# Patient Record
Sex: Male | Born: 1940 | Race: White | Hispanic: No | Marital: Married | Smoking: Former smoker
Health system: Southern US, Community
[De-identification: ages and names within clinical notes are randomized; demographics above are authoritative.]

## PROBLEM LIST (undated history)

## (undated) DIAGNOSIS — K209 Esophagitis, unspecified without bleeding: Secondary | ICD-10-CM

## (undated) DIAGNOSIS — K219 Gastro-esophageal reflux disease without esophagitis: Secondary | ICD-10-CM

## (undated) DIAGNOSIS — D126 Benign neoplasm of colon, unspecified: Secondary | ICD-10-CM

## (undated) DIAGNOSIS — K2980 Duodenitis without bleeding: Secondary | ICD-10-CM

## (undated) DIAGNOSIS — D759 Disease of blood and blood-forming organs, unspecified: Secondary | ICD-10-CM

## (undated) DIAGNOSIS — E119 Type 2 diabetes mellitus without complications: Secondary | ICD-10-CM

## (undated) DIAGNOSIS — K222 Esophageal obstruction: Secondary | ICD-10-CM

## (undated) DIAGNOSIS — K635 Polyp of colon: Secondary | ICD-10-CM

## (undated) DIAGNOSIS — C801 Malignant (primary) neoplasm, unspecified: Secondary | ICD-10-CM

## (undated) DIAGNOSIS — K5790 Diverticulosis of intestine, part unspecified, without perforation or abscess without bleeding: Secondary | ICD-10-CM

## (undated) DIAGNOSIS — K319 Disease of stomach and duodenum, unspecified: Secondary | ICD-10-CM

## (undated) HISTORY — PX: TONSILLECTOMY: SUR1361

## (undated) HISTORY — PX: COLONOSCOPY: SHX174

## (undated) HISTORY — PX: BACK SURGERY: SHX140

## (undated) HISTORY — PX: APPENDECTOMY: SHX54

## (undated) HISTORY — PX: ESOPHAGOGASTRODUODENOSCOPY: SHX1529

## (undated) HISTORY — PX: COLON SURGERY: SHX602

## (undated) HISTORY — PX: OTHER SURGICAL HISTORY: SHX169

---

## 2007-12-16 ENCOUNTER — Ambulatory Visit: Payer: Self-pay | Admitting: Gastroenterology

## 2008-01-08 ENCOUNTER — Other Ambulatory Visit: Payer: Self-pay

## 2008-01-09 ENCOUNTER — Inpatient Hospital Stay: Payer: Self-pay | Admitting: Surgery

## 2008-12-10 ENCOUNTER — Ambulatory Visit: Payer: Self-pay | Admitting: Gastroenterology

## 2011-06-02 ENCOUNTER — Ambulatory Visit: Payer: Self-pay | Admitting: Gastroenterology

## 2011-06-05 LAB — PATHOLOGY REPORT

## 2014-08-18 ENCOUNTER — Ambulatory Visit: Payer: Self-pay | Admitting: Gastroenterology

## 2014-08-18 DIAGNOSIS — K222 Esophageal obstruction: Secondary | ICD-10-CM

## 2014-08-18 HISTORY — DX: Esophageal obstruction: K22.2

## 2014-09-16 ENCOUNTER — Ambulatory Visit: Payer: Self-pay | Admitting: Gastroenterology

## 2014-09-17 LAB — CREATININE, SERUM
Creatinine: 1.24 mg/dL
GFR CALC NON AF AMER: 58 — AB

## 2014-10-16 ENCOUNTER — Other Ambulatory Visit: Payer: Self-pay | Admitting: Gastroenterology

## 2014-10-16 DIAGNOSIS — K529 Noninfective gastroenteritis and colitis, unspecified: Secondary | ICD-10-CM

## 2014-10-19 LAB — SURGICAL PATHOLOGY

## 2014-10-28 ENCOUNTER — Other Ambulatory Visit: Payer: Self-pay | Admitting: Gastroenterology

## 2014-10-28 ENCOUNTER — Ambulatory Visit
Admission: RE | Admit: 2014-10-28 | Discharge: 2014-10-28 | Disposition: A | Discharge status: Home / Self Care | Payer: Medicare Other | Source: Ambulatory Visit | Attending: Gastroenterology | Admitting: Gastroenterology

## 2014-10-28 DIAGNOSIS — K5730 Diverticulosis of large intestine without perforation or abscess without bleeding: Secondary | ICD-10-CM | POA: Diagnosis not present

## 2014-10-28 DIAGNOSIS — Z1211 Encounter for screening for malignant neoplasm of colon: Secondary | ICD-10-CM | POA: Diagnosis not present

## 2014-10-28 DIAGNOSIS — Z539 Procedure and treatment not carried out, unspecified reason: Secondary | ICD-10-CM | POA: Insufficient documentation

## 2014-10-28 DIAGNOSIS — K529 Noninfective gastroenteritis and colitis, unspecified: Secondary | ICD-10-CM

## 2015-01-01 ENCOUNTER — Encounter: Payer: Self-pay | Admitting: *Deleted

## 2015-01-04 ENCOUNTER — Ambulatory Visit
Admission: RE | Admit: 2015-01-04 | Discharge: 2015-01-04 | Disposition: A | Discharge status: Home / Self Care | Payer: Medicare Other | Source: Ambulatory Visit | Attending: Gastroenterology | Admitting: Gastroenterology

## 2015-01-04 ENCOUNTER — Ambulatory Visit: Payer: Medicare Other | Admitting: Anesthesiology

## 2015-01-04 ENCOUNTER — Encounter
Admission: RE | Disposition: A | Discharge status: Home / Self Care | Payer: Self-pay | Source: Ambulatory Visit | Attending: Gastroenterology

## 2015-01-04 DIAGNOSIS — Z79899 Other long term (current) drug therapy: Secondary | ICD-10-CM | POA: Diagnosis not present

## 2015-01-04 DIAGNOSIS — E669 Obesity, unspecified: Secondary | ICD-10-CM | POA: Diagnosis not present

## 2015-01-04 DIAGNOSIS — K2980 Duodenitis without bleeding: Secondary | ICD-10-CM | POA: Insufficient documentation

## 2015-01-04 DIAGNOSIS — K317 Polyp of stomach and duodenum: Secondary | ICD-10-CM | POA: Insufficient documentation

## 2015-01-04 DIAGNOSIS — J449 Chronic obstructive pulmonary disease, unspecified: Secondary | ICD-10-CM | POA: Diagnosis not present

## 2015-01-04 DIAGNOSIS — K210 Gastro-esophageal reflux disease with esophagitis: Secondary | ICD-10-CM | POA: Diagnosis not present

## 2015-01-04 DIAGNOSIS — F17210 Nicotine dependence, cigarettes, uncomplicated: Secondary | ICD-10-CM | POA: Diagnosis not present

## 2015-01-04 DIAGNOSIS — Z859 Personal history of malignant neoplasm, unspecified: Secondary | ICD-10-CM | POA: Diagnosis not present

## 2015-01-04 DIAGNOSIS — K2970 Gastritis, unspecified, without bleeding: Secondary | ICD-10-CM | POA: Diagnosis not present

## 2015-01-04 DIAGNOSIS — E119 Type 2 diabetes mellitus without complications: Secondary | ICD-10-CM | POA: Insufficient documentation

## 2015-01-04 DIAGNOSIS — Z86010 Personal history of colonic polyps: Secondary | ICD-10-CM | POA: Insufficient documentation

## 2015-01-04 DIAGNOSIS — K319 Disease of stomach and duodenum, unspecified: Secondary | ICD-10-CM | POA: Insufficient documentation

## 2015-01-04 HISTORY — PX: ESOPHAGOGASTRODUODENOSCOPY: SHX5428

## 2015-01-04 HISTORY — DX: Gastro-esophageal reflux disease without esophagitis: K21.9

## 2015-01-04 HISTORY — DX: Type 2 diabetes mellitus without complications: E11.9

## 2015-01-04 HISTORY — DX: Malignant (primary) neoplasm, unspecified: C80.1

## 2015-01-04 HISTORY — DX: Disease of blood and blood-forming organs, unspecified: D75.9

## 2015-01-04 HISTORY — DX: Duodenitis without bleeding: K29.80

## 2015-01-04 HISTORY — DX: Benign neoplasm of colon, unspecified: D12.6

## 2015-01-04 SURGERY — EGD (ESOPHAGOGASTRODUODENOSCOPY)
Anesthesia: General

## 2015-01-04 MED ORDER — GLYCOPYRROLATE 0.2 MG/ML IJ SOLN
INTRAMUSCULAR | Status: DC | PRN
  Administered 2015-01-04: 0.2 mg via INTRAVENOUS

## 2015-01-04 MED ORDER — GLUCAGON HCL RDNA (DIAGNOSTIC) 1 MG IJ SOLR
INTRAMUSCULAR | Status: DC
Start: 2015-01-04 — End: 2015-01-04
  Filled 2015-01-04: qty 1

## 2015-01-04 MED ORDER — EPINEPHRINE HCL 0.1 MG/ML IJ SOSY
PREFILLED_SYRINGE | INTRAMUSCULAR | Status: AC
  Filled 2015-01-04: qty 10

## 2015-01-04 MED ORDER — PROPOFOL 10 MG/ML IV BOLUS
INTRAVENOUS | Status: DC | PRN
  Administered 2015-01-04: 50 mg via INTRAVENOUS

## 2015-01-04 MED ORDER — PROPOFOL INFUSION 10 MG/ML OPTIME
INTRAVENOUS | Status: DC | PRN
Start: 2015-01-04 — End: 2015-01-04
  Administered 2015-01-04: 50 ug/kg/min via INTRAVENOUS

## 2015-01-04 MED ORDER — LIDOCAINE HCL (CARDIAC) 20 MG/ML IV SOLN
INTRAVENOUS | Status: DC | PRN
  Administered 2015-01-04: 50 mg via INTRAVENOUS

## 2015-01-04 MED ORDER — GLUCAGON (RDNA) 1 MG IJ KIT
PACK | INTRAMUSCULAR | Status: DC | PRN
  Administered 2015-01-04 (×2): .2 mg via INTRAVENOUS

## 2015-01-04 MED ORDER — GLUCAGON HCL (RDNA) 1 MG IJ SOLR
INTRAMUSCULAR | Status: DC | PRN
  Administered 2015-01-04 (×2): 0.2 mg via INTRAVENOUS

## 2015-01-04 MED ORDER — SODIUM CHLORIDE 0.9 % IJ SOLN
INTRAMUSCULAR | Status: DC | PRN
  Administered 2015-01-04: 5 mL via INTRAMUSCULAR

## 2015-01-04 MED ORDER — SODIUM CHLORIDE 0.9 % IV SOLN
INTRAVENOUS | Status: DC
  Administered 2015-01-04: 09:00:00 via INTRAVENOUS
  Administered 2015-01-04: 1000 mL via INTRAVENOUS

## 2015-01-04 NOTE — Transfer of Care (Signed)
Immediate Anesthesia Transfer of Care Note  Patient: Nicolas Mayo  Procedure(s) Performed: Procedure(s): ESOPHAGOGASTRODUODENOSCOPY (EGD) (N/A)  Patient Location: PACU, Endo  Anesthesia Type:General  Level of Consciousness: Alert, Awake, Oriented  Airway & Oxygen Therapy: Patient Spontanous Breathing  Post-op Assessment: Report given to RN  Post vital signs: Reviewed and stable  Last Vitals:  Filed Vitals:   01/04/15 1010  BP: 134/44  Pulse: 77  Temp: 36.1 C  Resp: 16    Complications: No apparent anesthesia complications

## 2015-01-04 NOTE — Op Note (Signed)
[facility] Gastroenterology Patient Name: Nicolas Mayo Procedure Date: 01/04/2015 8:47 AM MRN: [id number] Account #: [id number] Date of Birth: 26-Feb-1941 Admit Type: Outpatient Age: 74 Room: [facility] ENDO ROOM 3 Gender: Male Note Status: Finalized Procedure:         Upper GI endoscopy Indications:       Therapeutic procedure, For therapy of polyps in the                     duodenum Providers:         Lollie Sails, MD Medicines:         Monitored Anesthesia Care Complications:     No immediate complications., Minor hemorrhage - stopped                     spontaneously Procedure:         Pre-Anesthesia Assessment:                    - ASA Grade Assessment: III - A patient with severe                     systemic disease.                    After obtaining informed consent, the endoscope was passed                     under direct vision. Throughout the procedure, the                     patient's blood pressure, pulse, and oxygen saturations                     were monitored continuously. The Olympus GIF-160 endoscope                     (S#. [id number]) was introduced through the mouth, and                     advanced to the third part of duodenum. The upper GI                     endoscopy was unusually difficult due to excessive                     bleeding. Successful completion of the procedure was aided                     by controlling the bleeding. Findings:      LA Grade A (one or more mucosal breaks less than 5 mm, not extending       between tops of 2 mucosal folds) esophagitis with no bleeding was found.      The exam of the esophagus was otherwise normal.      Diffuse moderate inflammation characterized by erosions, erythema and       friability was found in the entire examined stomach. Biopsies were taken       with a cold forceps for histology. Biopsies were taken with a cold       forceps for Helicobacter pylori testing.      The cardia and  gastric fundus were normal on retroflexion.      A single 7 mm sessile polyp was found in the second part of the  duodenum. The polyp was removed with a cold snare. Resection and       retrieval were complete. Area was unsuccessfully injected with 5 mL of a       1:10,000 solution of epinephrine for hemostasis. One hemostatic clip was       successfully placed. There was no bleeding at the end of the maneuver.      A single 6 mm sessile polyp with no bleeding was found in the second       part of the duodenum. The polyp was removed with a hot snare. Resection       and retrieval were complete.      A single 4 mm sessile polyp with no bleeding was found in the second       part of the duodenum. The polyp was removed with a cold biopsy forceps.       Resection and retrieval were complete. Impression:        - LA Grade A erosive esophagitis.                    - Gastritis. Biopsied. Recommendation:    - Await pathology results.                    - Use Protonix (pantoprazole) 40 mg PO BID for 1 month.                    - Use Protonix (pantoprazole) 40 mg PO daily indefinitely.                    - Return to GI clinic in 3 weeks. Procedure Code(s): --- Professional ---                    [phone], Esophagogastroduodenoscopy, flexible, transoral;                     with removal of tumor(s), polyp(s), or other lesion(s) by                     snare technique Diagnosis Code(s): --- Professional ---                    530.19, Other esophagitis                    535.50, Unspecified gastritis and gastroduodenitis,                     without mention of hemorrhage                    211.2, Benign neoplasm of duodenum, jejunum, and ileum CPT copyright 2014 American Medical Association. All rights reserved. The codes documented in this report are preliminary and upon coder review may  be revised to meet current compliance requirements. Lollie Sails, MD 01/04/2015 10:17:34 AM This report  has been signed electronically. Number of Addenda: 0 Note Initiated On: 01/04/2015 8:47 AM      [employer]

## 2015-01-04 NOTE — H&P (Signed)
Outpatient short stay form Pre-procedure 01/04/2015 8:53 AM Lollie Sails MD  Primary Physician: [facility]  Reason for visit:  Duodenal adenoma follow-up  History of present illness:  She is a 74 year old male who was last seen for EGD in February 2016. 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 His were noted on biopsy. He is presenting today to assure complete removal.  He does not take any blood thinning medications or aspirin daily.    Current facility-administered medications:  .  0.9 %  sodium chloride infusion, , Intravenous, Continuous, Lollie Sails, MD, Last Rate: 50 mL/hr at 01/04/15 0817, 1,000 mL at 01/04/15 1696  Prescriptions prior to admission  Medication Sig Dispense Refill Last Dose  . imatinib (GLEEVEC) 100 MG tablet Take 100 mg by mouth 3 (three) times daily. Take with meals and large glass of water.Caution:Chemotherapy     . Multiple Vitamin (MULTIVITAMIN) tablet Take 1 tablet by mouth daily.     Marland Kitchen omeprazole (PRILOSEC) 20 MG capsule Take 20 mg by mouth 2 (two) times daily before a meal.        No Known Allergies   Past Medical History  Diagnosis Date  . Cancer   . Blood dyscrasia   . Tubulovillous adenoma polyp of colon   . Diabetes mellitus without complication   . GERD (gastroesophageal reflux disease)   . Duodenitis   . Duodenitis     Review of systems:      Physical Exam    Heart and lungs: Regular rate and rhythm without rub or gallop, lungs are bilaterally clear    HEENT: Normocephalic atraumatic eyes are anicteric    Other:     Pertinant exam for procedure: Soft protuberant nontender nondistended bowel sounds positive normoactive    Planned proceedures: EGD and indicated procedures. I have discussed the risks benefits and complications of procedures to include not limited to bleeding, infection, perforation and the risk of sedation and the patient wishes to proceed.    Lollie Sails, MD Gastroenterology 01/04/2015   8:53 AM

## 2015-01-04 NOTE — Anesthesia Preprocedure Evaluation (Signed)
Anesthesia Evaluation  Patient identified by MRN, date of birth, ID band Patient awake    Reviewed: Allergy & Precautions, NPO status , Patient's Chart, lab work & pertinent test results  Airway Mallampati: III       Dental  (+) Edentulous Lower, Edentulous Upper   Pulmonary COPDCurrent Smoker,  + rhonchi   + decreased breath sounds      Cardiovascular negative cardio ROS Normal cardiovascular exam    Neuro/Psych negative neurological ROS  negative psych ROS   GI/Hepatic Neg liver ROS, GERD-  ,  Endo/Other  diabetes, Type 2  Renal/GU negative Renal ROS     Musculoskeletal negative musculoskeletal ROS (+)   Abdominal (+) + obese,   Peds negative pediatric ROS (+)  Hematology   Anesthesia Other Findings   Reproductive/Obstetrics negative OB ROS                             Anesthesia Physical Anesthesia Plan  ASA: III  Anesthesia Plan: General   Post-op Pain Management:    Induction: Intravenous  Airway Management Planned: Nasal Cannula  Additional Equipment:   Intra-op Plan:   Post-operative Plan:   Informed Consent: I have reviewed the patients History and Physical, chart, labs and discussed the procedure including the risks, benefits and alternatives for the proposed anesthesia with the patient or authorized representative who has indicated his/her understanding and acceptance.     Plan Discussed with: CRNA  Anesthesia Plan Comments:         Anesthesia Quick Evaluation

## 2015-01-04 NOTE — Anesthesia Postprocedure Evaluation (Signed)
  Anesthesia Post-op Note  Patient: Nicolas Mayo  Procedure(s) Performed: Procedure(s): ESOPHAGOGASTRODUODENOSCOPY (EGD) (N/A)  Anesthesia type:General  Patient location: PACU  Post pain: Pain level controlled  Post assessment: Post-op Vital signs reviewed, Patient's Cardiovascular Status Stable, Respiratory Function Stable, Patent Airway and No signs of Nausea or vomiting  Post vital signs: Reviewed and stable  Last Vitals:  Filed Vitals:   01/04/15 1010  BP: 134/44  Pulse: 77  Temp: 36.1 C  Resp: 16    Level of consciousness: awake, alert  and patient cooperative  Complications: No apparent anesthesia complications

## 2015-01-05 ENCOUNTER — Encounter: Payer: Self-pay | Admitting: Gastroenterology

## 2015-01-05 MED FILL — Glucagon HCl (rDNA) Diagnostic For Inj 1 MG (Base Equiv): INTRAMUSCULAR | Qty: 0.2 | Status: AC

## 2015-01-06 LAB — SURGICAL PATHOLOGY

## 2016-01-10 ENCOUNTER — Encounter: Payer: Self-pay | Admitting: *Deleted

## 2016-01-11 ENCOUNTER — Ambulatory Visit: Payer: Medicare Other | Admitting: Anesthesiology

## 2016-01-11 ENCOUNTER — Ambulatory Visit
Admission: RE | Admit: 2016-01-11 | Discharge: 2016-01-11 | Disposition: A | Discharge status: Home / Self Care | Payer: Medicare Other | Source: Ambulatory Visit | Attending: Gastroenterology | Admitting: Gastroenterology

## 2016-01-11 ENCOUNTER — Encounter
Admission: RE | Disposition: A | Discharge status: Home / Self Care | Payer: Self-pay | Source: Ambulatory Visit | Attending: Gastroenterology

## 2016-01-11 DIAGNOSIS — K5790 Diverticulosis of intestine, part unspecified, without perforation or abscess without bleeding: Secondary | ICD-10-CM | POA: Diagnosis not present

## 2016-01-11 DIAGNOSIS — Z856 Personal history of leukemia: Secondary | ICD-10-CM | POA: Insufficient documentation

## 2016-01-11 DIAGNOSIS — K210 Gastro-esophageal reflux disease with esophagitis: Secondary | ICD-10-CM | POA: Diagnosis not present

## 2016-01-11 DIAGNOSIS — K2970 Gastritis, unspecified, without bleeding: Secondary | ICD-10-CM | POA: Diagnosis present

## 2016-01-11 DIAGNOSIS — K2960 Other gastritis without bleeding: Secondary | ICD-10-CM | POA: Diagnosis not present

## 2016-01-11 DIAGNOSIS — F17200 Nicotine dependence, unspecified, uncomplicated: Secondary | ICD-10-CM | POA: Insufficient documentation

## 2016-01-11 DIAGNOSIS — Z86010 Personal history of colonic polyps: Secondary | ICD-10-CM | POA: Diagnosis not present

## 2016-01-11 DIAGNOSIS — K2210 Ulcer of esophagus without bleeding: Secondary | ICD-10-CM | POA: Diagnosis not present

## 2016-01-11 DIAGNOSIS — E119 Type 2 diabetes mellitus without complications: Secondary | ICD-10-CM | POA: Diagnosis not present

## 2016-01-11 DIAGNOSIS — Z79899 Other long term (current) drug therapy: Secondary | ICD-10-CM | POA: Insufficient documentation

## 2016-01-11 HISTORY — DX: Esophageal obstruction: K22.2

## 2016-01-11 HISTORY — DX: Diverticulosis of intestine, part unspecified, without perforation or abscess without bleeding: K57.90

## 2016-01-11 HISTORY — PX: ESOPHAGOGASTRODUODENOSCOPY (EGD) WITH PROPOFOL: SHX5813

## 2016-01-11 HISTORY — DX: Disease of stomach and duodenum, unspecified: K31.9

## 2016-01-11 LAB — CBC
HCT: 42.5 % (ref 40.0–52.0)
HEMOGLOBIN: 14.9 g/dL (ref 13.0–18.0)
MCH: 35.5 pg — ABNORMAL HIGH (ref 26.0–34.0)
MCHC: 35.1 g/dL (ref 32.0–36.0)
MCV: 101.2 fL — ABNORMAL HIGH (ref 80.0–100.0)
PLATELETS: 184 10*3/uL (ref 150–440)
RBC: 4.2 MIL/uL — AB (ref 4.40–5.90)
RDW: 15.8 % — ABNORMAL HIGH (ref 11.5–14.5)
WBC: 6.7 10*3/uL (ref 3.8–10.6)

## 2016-01-11 LAB — GLUCOSE, CAPILLARY: GLUCOSE-CAPILLARY: 139 mg/dL — AB (ref 65–99)

## 2016-01-11 SURGERY — ESOPHAGOGASTRODUODENOSCOPY (EGD) WITH PROPOFOL
Anesthesia: General

## 2016-01-11 MED ORDER — PROPOFOL 500 MG/50ML IV EMUL
INTRAVENOUS | Status: DC | PRN
  Administered 2016-01-11: 160 ug/kg/min via INTRAVENOUS

## 2016-01-11 MED ORDER — LIDOCAINE 2% (20 MG/ML) 5 ML SYRINGE
INTRAMUSCULAR | Status: DC | PRN
  Administered 2016-01-11: 40 mg via INTRAVENOUS

## 2016-01-11 MED ORDER — FENTANYL CITRATE (PF) 100 MCG/2ML IJ SOLN
INTRAMUSCULAR | Status: DC | PRN
Start: 2016-01-11 — End: 2016-01-11
  Administered 2016-01-11: 50 ug via INTRAVENOUS

## 2016-01-11 MED ORDER — SODIUM CHLORIDE 0.9 % IV SOLN: INTRAVENOUS | Status: DC

## 2016-01-11 MED ORDER — GLUCAGON HCL RDNA (DIAGNOSTIC) 1 MG IJ SOLR
INTRAMUSCULAR | Status: AC
  Administered 2016-01-11: .2 mg via INTRAVENOUS
  Filled 2016-01-11: qty 1

## 2016-01-11 MED ORDER — PROPOFOL 10 MG/ML IV BOLUS
INTRAVENOUS | Status: DC | PRN
  Administered 2016-01-11: 100 mg via INTRAVENOUS

## 2016-01-11 MED ORDER — SODIUM CHLORIDE 0.9 % IV SOLN
INTRAVENOUS | Status: DC
  Administered 2016-01-11: 10:00:00 via INTRAVENOUS
  Administered 2016-01-11: 1000 mL via INTRAVENOUS

## 2016-01-11 MED ORDER — MIDAZOLAM HCL 5 MG/5ML IJ SOLN
INTRAMUSCULAR | Status: DC | PRN
  Administered 2016-01-11: 1 mg via INTRAVENOUS

## 2016-01-11 MED ORDER — PHENYLEPHRINE HCL 10 MG/ML IJ SOLN
INTRAMUSCULAR | Status: DC | PRN
  Administered 2016-01-11: 100 ug via INTRAVENOUS

## 2016-01-11 NOTE — Transfer of Care (Signed)
Immediate Anesthesia Transfer of Care Note  Patient: Nicolas Mayo  Procedure(s) Performed: Procedure(s): ESOPHAGOGASTRODUODENOSCOPY (EGD) WITH PROPOFOL (N/A)  Patient Location: PACU and Endoscopy Unit  Anesthesia Type:General  Level of Consciousness: sedated  Airway & Oxygen Therapy: Patient Spontanous Breathing and Patient connected to nasal cannula oxygen  Post-op Assessment: Report given to RN and Post -op Vital signs reviewed and stable  Post vital signs: Reviewed and stable  Last Vitals:  Filed Vitals:   01/11/16 0747  BP: 135/68  Pulse: 74  Temp: 35.6 C  Resp: 16    Last Pain:  Filed Vitals:   01/11/16 0749  PainSc: 3          Complications: No apparent anesthesia complications

## 2016-01-11 NOTE — H&P (Signed)
Outpatient short stay form Pre-procedure 01/11/2016 9:21 AM Lollie Sails MD  Primary Physician: Conni Slipper NP  Reason for visit:  EGD  History of present illness:  Patient is a 75 year old male presenting today for EGD. He has a history of multiples duodenal adenomas being removed from the hospital duodenum EGD done 01/04/2015. None of these had high-grade dysplasia. He denies use of any aspirin or blood thinning agents. He states he does need a refill on his Protonix.   Current facility-administered medications:  .  0.9 %  sodium chloride infusion, , Intravenous, Continuous, Lollie Sails, MD, Last Rate: 20 mL/hr at 01/11/16 0847, 1,000 mL at 01/11/16 0847 .  0.9 %  sodium chloride infusion, , Intravenous, Continuous, Lollie Sails, MD  Prescriptions prior to admission  Medication Sig Dispense Refill Last Dose  . Multiple Vitamin (MULTIVITAMIN) tablet Take 1 tablet by mouth daily. Reported on 01/10/2016   Past Week at Unknown time  . omeprazole (PRILOSEC) 20 MG capsule Take 20 mg by mouth 2 (two) times daily before a meal.   01/10/2016 at Unknown time  . vitamin B-12 (CYANOCOBALAMIN) 1000 MCG tablet Take 1,000 mcg by mouth daily.   Past Week at Unknown time  . imatinib (GLEEVEC) 100 MG tablet Take 100 mg by mouth 3 (three) times daily. Reported on 01/10/2016   Completed Course at Unknown time  . pantoprazole (PROTONIX) 40 MG tablet Take 40 mg by mouth daily. Reported on 01/11/2016   Not Taking at Unknown time     No Known Allergies   Past Medical History  Diagnosis Date  . Blood dyscrasia   . Tubulovillous adenoma polyp of colon   . Diabetes mellitus without complication ([place])   . GERD (gastroesophageal reflux disease)   . Duodenitis   . Duodenitis   . Diverticulosis   . Gastropathy   . 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 Schatzki's ring 08/18/2014    Review of systems:      Physical Exam    Heart and lungs: Regular rate and rhythm without rub or gallop, lungs  are bilaterally clear.    HEENT: Normocephalic atraumatic eyes are anicteric    Other:     Pertinant exam for procedure: Soft nontender nondistended, protuberant, bowel sounds are positive normoactive    Planned proceedures: EGD and indicated procedures. I have discussed the risks benefits and complications of procedures to include not limited to bleeding, infection, perforation and the risk of sedation and the patient wishes to proceed.    Lollie Sails, MD Gastroenterology 01/11/2016  9:21 AM

## 2016-01-11 NOTE — OR Nursing (Signed)
Iv in left hand removed witout incident.  Catheter intact upon removal. Site clear of redness or swelling.

## 2016-01-11 NOTE — Op Note (Signed)
[facility] Gastroenterology Patient Name: Nicolas Mayo Procedure Date: 01/11/2016 8:29 AM MRN: [id number] Account #: [id number] Date of Birth: February 14, 1941 Admit Type: Outpatient Age: 75 Room: [facility] ENDO ROOM 3 Gender: Male Note Status: Finalized Procedure:            Upper GI endoscopy Indications:          Follow-up of polyps in the duodenum Providers:            Lollie Sails, MD Referring MD:         [facility], MD (Referring MD) Medicines:            Monitored Anesthesia Care Complications:        No immediate complications. Procedure:            Pre-Anesthesia Assessment:                       - ASA Grade Assessment: III - A patient with severe                        systemic disease.                       After obtaining informed consent, the endoscope was                        passed under direct vision. Throughout the procedure,                        the patient's blood pressure, pulse, and oxygen                        saturations were monitored continuously. The Endoscope                        was introduced through the mouth, and advanced to the                        third part of duodenum. The upper GI endoscopy was                        accomplished without difficulty. The patient tolerated                        the procedure well. Findings:      LA Grade B (one or more mucosal breaks greater than 5 mm, not extending       between the tops of two mucosal folds) esophagitis with no bleeding was       found. Biopsies were taken with a cold forceps for histology.      The exam of the esophagus was otherwise normal.      Diffuse mild inflammation characterized by congestion (edema) and       erythema was found in the gastric body and in the gastric antrum.       Biopsies were taken with a cold forceps for histology.      The cardia and gastric fundus were normal on retroflexion.      The examined duodenum was normal with the exception of a  patch of       whitish colored mucosa, possibly at the previous polypectomy site in the  third portion of the duodenum. Motility was vigorous and .2 mg of       glucagon was given to assist evaluation. This was biopsied with a cold       forceps for histology. Impression:           - LA Grade B erosive esophagitis. Biopsied.                       - Bile gastritis. Biopsied.                       - Normal examined duodenum. Biopsied. Recommendation:       - Await pathology results.                       - Continue present medications.                       - Return to GI clinic in 3 weeks. Procedure Code(s):    --- Professional ---                       [phone], Esophagogastroduodenoscopy, flexible, transoral;                        with biopsy, single or multiple Diagnosis Code(s):    --- Professional ---                       K20.8, Other esophagitis                       K29.60, Other gastritis without bleeding                       K31.7, Polyp of stomach and duodenum CPT copyright 2016 American Medical Association. All rights reserved. The codes documented in this report are preliminary and upon coder review may  be revised to meet current compliance requirements. Lollie Sails, MD 01/11/2016 10:02:37 AM This report has been signed electronically. Number of Addenda: 0 Note Initiated On: 01/11/2016 8:29 AM      [facility]

## 2016-01-11 NOTE — Anesthesia Postprocedure Evaluation (Signed)
Anesthesia Post Note  Patient: Nicolas Mayo  Procedure(s) Performed: Procedure(s) (LRB): ESOPHAGOGASTRODUODENOSCOPY (EGD) WITH PROPOFOL (N/A)  Patient location during evaluation: PACU Anesthesia Type: General Level of consciousness: awake and alert Pain management: pain level controlled Vital Signs Assessment: post-procedure vital signs reviewed and stable Respiratory status: spontaneous breathing, nonlabored ventilation, respiratory function stable and patient connected to nasal cannula oxygen Cardiovascular status: blood pressure returned to baseline and stable Postop Assessment: no signs of nausea or vomiting Anesthetic complications: no    Last Vitals:  Filed Vitals:   01/11/16 0747 01/11/16 1002  BP: 135/68 95/82  Pulse: 74 66  Temp: 35.6 C 36.4 C  Resp: 16 11    Last Pain:  Filed Vitals:   01/11/16 1004  PainSc: [facility]

## 2016-01-11 NOTE — Anesthesia Preprocedure Evaluation (Signed)
Anesthesia Evaluation  Patient identified by MRN, date of birth, ID band Patient awake    Reviewed: Allergy & Precautions, H&P , NPO status , Patient's Chart, lab work & pertinent test results, reviewed documented beta blocker date and time   Airway Mallampati: II   Neck ROM: full    Dental  (+) Poor Dentition   Pulmonary neg pulmonary ROS, Current Smoker,    Pulmonary exam normal        Cardiovascular negative cardio ROS Normal cardiovascular exam Rhythm:regular Rate:Normal     Neuro/Psych negative neurological ROS  negative psych ROS   GI/Hepatic negative GI ROS, Neg liver ROS, GERD  Medicated,  Endo/Other  negative endocrine ROSdiabetes  Renal/GU negative Renal ROS  negative genitourinary   Musculoskeletal   Abdominal   Peds  Hematology negative hematology ROS (+) Blood dyscrasia, ,   Anesthesia Other Findings Past Medical History:   Blood dyscrasia                                              Tubulovillous adenoma polyp of colon                         Diabetes mellitus without complication (HCC)                 GERD (gastroesophageal reflux disease)                       Duodenitis                                                   Duodenitis                                                   Diverticulosis                                               Gastropathy                                                  Cancer ([place])                                                   Comment:leukemia   Schatzki's ring                                 08/18/2014  Past Surgical History:   ESOPHAGOGASTRODUODENOSCOPY  COLONOSCOPY                                                   COLON SURGERY                                                 TONSILLECTOMY                                                 disectomy                                                     partial amputation of  finger                                  pilonidial cyst surgery                                       resection of colon polyps                                     ESOPHAGOGASTRODUODENOSCOPY                      N/A 01/04/2015      Comment:Procedure: ESOPHAGOGASTRODUODENOSCOPY (EGD);                Surgeon: Lollie Sails, MD;  Location: [facility]              ENDOSCOPY;  Service: Endoscopy;  Laterality:               N/A;   APPENDECTOMY                                                BMI    Body Mass Index   28.47 kg/m 2     Reproductive/Obstetrics                             Anesthesia Physical Anesthesia Plan  ASA: III  Anesthesia Plan: General   Post-op Pain Management:    Induction:   Airway Management Planned:   Additional Equipment:   Intra-op Plan:   Post-operative Plan:   Informed Consent: I have reviewed the patients History and Physical, chart, labs and discussed the procedure including the risks, benefits and alternatives for the proposed anesthesia with the patient or authorized representative who has indicated his/her understanding and acceptance.   Dental Advisory Given  Plan Discussed with: CRNA  Anesthesia Plan Comments:  Anesthesia Quick Evaluation  

## 2016-01-12 ENCOUNTER — Encounter: Payer: Self-pay | Admitting: Gastroenterology

## 2016-01-14 LAB — SURGICAL PATHOLOGY

## 2016-04-19 ENCOUNTER — Encounter: Payer: Self-pay | Admitting: *Deleted

## 2016-04-20 ENCOUNTER — Encounter: Payer: Self-pay | Admitting: Anesthesiology

## 2016-04-20 ENCOUNTER — Ambulatory Visit: Payer: Medicare Other | Admitting: Anesthesiology

## 2016-04-20 ENCOUNTER — Ambulatory Visit
Admission: RE | Admit: 2016-04-20 | Discharge: 2016-04-20 | Disposition: A | Discharge status: Home / Self Care | Payer: Medicare Other | Source: Ambulatory Visit | Attending: Gastroenterology | Admitting: Gastroenterology

## 2016-04-20 ENCOUNTER — Encounter
Admission: RE | Disposition: A | Discharge status: Home / Self Care | Payer: Self-pay | Source: Ambulatory Visit | Attending: Gastroenterology

## 2016-04-20 DIAGNOSIS — Z86010 Personal history of colonic polyps: Secondary | ICD-10-CM | POA: Insufficient documentation

## 2016-04-20 DIAGNOSIS — K529 Noninfective gastroenteritis and colitis, unspecified: Secondary | ICD-10-CM | POA: Insufficient documentation

## 2016-04-20 DIAGNOSIS — K621 Rectal polyp: Secondary | ICD-10-CM | POA: Diagnosis not present

## 2016-04-20 DIAGNOSIS — D123 Benign neoplasm of transverse colon: Secondary | ICD-10-CM | POA: Diagnosis present

## 2016-04-20 DIAGNOSIS — E119 Type 2 diabetes mellitus without complications: Secondary | ICD-10-CM | POA: Diagnosis not present

## 2016-04-20 DIAGNOSIS — K219 Gastro-esophageal reflux disease without esophagitis: Secondary | ICD-10-CM | POA: Diagnosis not present

## 2016-04-20 DIAGNOSIS — K5730 Diverticulosis of large intestine without perforation or abscess without bleeding: Secondary | ICD-10-CM | POA: Diagnosis not present

## 2016-04-20 HISTORY — PX: COLONOSCOPY WITH PROPOFOL: SHX5780

## 2016-04-20 LAB — CBC
HCT: 49 % (ref 40.0–52.0)
Hemoglobin: 16.5 g/dL (ref 13.0–18.0)
MCH: 35.2 pg — ABNORMAL HIGH (ref 26.0–34.0)
MCHC: 33.8 g/dL (ref 32.0–36.0)
MCV: 104.3 fL — ABNORMAL HIGH (ref 80.0–100.0)
Platelets: 208 10*3/uL (ref 150–440)
RBC: 4.69 MIL/uL (ref 4.40–5.90)
RDW: 14.7 % — AB (ref 11.5–14.5)
WBC: 6.9 10*3/uL (ref 3.8–10.6)

## 2016-04-20 SURGERY — COLONOSCOPY WITH PROPOFOL
Anesthesia: General

## 2016-04-20 MED ORDER — PROPOFOL 10 MG/ML IV BOLUS
INTRAVENOUS | Status: DC | PRN
  Administered 2016-04-20: 32 mg via INTRAVENOUS
  Administered 2016-04-20: 40 mg via INTRAVENOUS

## 2016-04-20 MED ORDER — LIDOCAINE HCL (CARDIAC) 10 MG/ML IV SOLN
INTRAVENOUS | Status: DC | PRN
  Administered 2016-04-20: 10 mg via INTRAVENOUS
  Administered 2016-04-20: 16 mg via INTRAVENOUS

## 2016-04-20 MED ORDER — GLYCOPYRROLATE 0.2 MG/ML IJ SOLN
INTRAMUSCULAR | Status: DC | PRN
  Administered 2016-04-20: 0.1 mg via INTRAVENOUS

## 2016-04-20 MED ORDER — MIDAZOLAM HCL 2 MG/2ML IJ SOLN
INTRAMUSCULAR | Status: DC | PRN
  Administered 2016-04-20: 2 mg via INTRAVENOUS

## 2016-04-20 MED ORDER — PROPOFOL 500 MG/50ML IV EMUL
INTRAVENOUS | Status: DC | PRN
  Administered 2016-04-20: 150 ug/kg/min via INTRAVENOUS

## 2016-04-20 MED ORDER — SODIUM CHLORIDE 0.9 % IV SOLN
INTRAVENOUS | Status: DC
Start: 2016-04-20 — End: 2016-04-20
  Administered 2016-04-20: 09:00:00 via INTRAVENOUS

## 2016-04-20 MED ORDER — SODIUM CHLORIDE 0.9 % IV SOLN: INTRAVENOUS | Status: DC

## 2016-04-20 NOTE — Anesthesia Preprocedure Evaluation (Signed)
Anesthesia Evaluation  Patient identified by MRN, date of birth, ID band Patient awake    Reviewed: Allergy & Precautions, H&P , NPO status , Patient's Chart, lab work & pertinent test results  History of Anesthesia Complications Negative for: history of anesthetic complications  Airway Mallampati: III  TM Distance: <3 FB Neck ROM: limited    Dental  (+) Poor Dentition, Missing   Pulmonary neg shortness of breath, Current Smoker,    Pulmonary exam normal breath sounds clear to auscultation       Cardiovascular Exercise Tolerance: Good (-) angina(-) Past MI and (-) DOE negative cardio ROS Normal cardiovascular exam Rhythm:regular Rate:Normal     Neuro/Psych negative neurological ROS  negative psych ROS   GI/Hepatic Neg liver ROS, GERD  Controlled,  Endo/Other  diabetes, Type 2  Renal/GU negative Renal ROS  negative genitourinary   Musculoskeletal   Abdominal   Peds  Hematology  (+) Blood dyscrasia, ,   Anesthesia Other Findings Past Medical History: No date: Blood dyscrasia No date: Cancer ([place])     Comment: leukemia No date: Diabetes mellitus without complication (HCC) No date: Diverticulosis No date: Duodenitis No date: Duodenitis No date: Gastropathy No date: GERD (gastroesophageal reflux disease) 08/18/2014: Schatzki's ring No date: Tubulovillous adenoma polyp of colon  Past Surgical History: No date: APPENDECTOMY No date: COLON SURGERY No date: COLONOSCOPY No date: disectomy No date: ESOPHAGOGASTRODUODENOSCOPY 01/04/2015: ESOPHAGOGASTRODUODENOSCOPY N/A     Comment: Procedure: ESOPHAGOGASTRODUODENOSCOPY (EGD);                Surgeon: Lollie Sails, MD;  Location: [facility]              ENDOSCOPY;  Service: Endoscopy;  Laterality:               N/A; 01/11/2016: ESOPHAGOGASTRODUODENOSCOPY (EGD) WITH PROPOFOL N/A     Comment: Procedure: ESOPHAGOGASTRODUODENOSCOPY (EGD)               WITH PROPOFOL;   Surgeon: Lollie Sails, MD;              Location: [facility] ENDOSCOPY;  Service: Endoscopy;               Laterality: N/A; No date: partial amputation of finger No date: pilonidial cyst surgery No date: resection of colon polyps No date: TONSILLECTOMY     Reproductive/Obstetrics negative OB ROS                             Anesthesia Physical Anesthesia Plan  ASA: III  Anesthesia Plan: General   Post-op Pain Management:    Induction:   Airway Management Planned:   Additional Equipment:   Intra-op Plan:   Post-operative Plan:   Informed Consent: I have reviewed the patients History and Physical, chart, labs and discussed the procedure including the risks, benefits and alternatives for the proposed anesthesia with the patient or authorized representative who has indicated his/her understanding and acceptance.   Dental Advisory Given  Plan Discussed with: Anesthesiologist, CRNA and Surgeon  Anesthesia Plan Comments:         Anesthesia Quick Evaluation

## 2016-04-20 NOTE — H&P (Signed)
Outpatient short stay form Pre-procedure 04/20/2016 9:16 AM Lollie Sails MD  Primary Physician: Milinda Cave NP  Reason for visit:  Colonoscopy  History of present illness:  Patient is a 75 year old male with a personal history of chronic loose stools. It has been felt that this might be due to his [place]. 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 There is also some evidence of some mild colitis. He tolerated his prep well. He takes no aspirin or blood thinning agents.    Current Facility-Administered Medications:  .  0.9 %  sodium chloride infusion, , Intravenous, Continuous, Lollie Sails, MD, Last Rate: 20 mL/hr at 04/20/16 0845 .  0.9 %  sodium chloride infusion, , Intravenous, Continuous, Lollie Sails, MD  Prescriptions Prior to Admission  Medication Sig Dispense Refill Last Dose  . imatinib (GLEEVEC) 100 MG tablet Take 100 mg by mouth 3 (three) times daily. Reported on 01/10/2016   04/19/2016 at Unknown time  . lisinopril (PRINIVIL,ZESTRIL) 5 MG tablet Take 5 mg by mouth daily.   Past Week at Unknown time  . pantoprazole (PROTONIX) 40 MG tablet Take 40 mg by mouth daily. Reported on 01/11/2016   04/19/2016 at Unknown time  . prazosin (MINIPRESS) 1 MG capsule Take 1 mg by mouth at bedtime.   Past Month at Unknown time  . Multiple Vitamin (MULTIVITAMIN) tablet Take 1 tablet by mouth daily. Reported on 01/10/2016   04/16/2016  . omeprazole (PRILOSEC) 20 MG capsule Take 20 mg by mouth 2 (two) times daily before a meal.   Not Taking at Unknown time  . vitamin B-12 (CYANOCOBALAMIN) 1000 MCG tablet Take 1,000 mcg by mouth daily.   04/16/2016     No Known Allergies   Past Medical History:  Diagnosis Date  . Blood dyscrasia   . 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 Diabetes mellitus without complication ([place])   . Diverticulosis   . Duodenitis   . Duodenitis   . Gastropathy   . GERD (gastroesophageal  reflux disease)   . Schatzki's ring 08/18/2014  . Tubulovillous adenoma polyp of colon     Review of systems:      Physical Exam    Heart and lungs: Regular rate and rhythm without rub or gallop, lungs are bilaterally clear.    HEENT: Normocephalic atraumatic eyes are anicteric    Other:     Pertinant exam for procedure: Soft nontender nondistended bowel sounds positive normoactive.    Planned proceedures: Colonoscopy procedures. I have discussed the risks benefits and complications of procedures to include not limited to bleeding, infection, perforation and the risk of sedation and the patient wishes to proceed.    Lollie Sails, MD Gastroenterology 04/20/2016  9:16 AM

## 2016-04-20 NOTE — Transfer of Care (Signed)
Immediate Anesthesia Transfer of Care Note  Patient: Nicolas Mayo  Procedure(s) Performed: Procedure(s): COLONOSCOPY WITH PROPOFOL (N/A)  Patient Location: PACU  Anesthesia Type:General  Level of Consciousness: unresponsive  Airway & Oxygen Therapy: Patient Spontanous Breathing and Patient connected to nasal cannula oxygen  Post-op Assessment: Report given to RN and Post -op Vital signs reviewed and stable  Post vital signs: Reviewed and stable  Last Vitals:  Vitals:   04/20/16 0820 04/20/16 1009  BP: 139/78 (!) 103/58  Pulse: 77 93  Resp: 20 20  Temp: 36.9 C 36.2 C    Last Pain:  Vitals:   04/20/16 1009  TempSrc: Tympanic         Complications: No apparent anesthesia complications

## 2016-04-20 NOTE — Op Note (Addendum)
[facility] Gastroenterology Patient Name: Nicolas Mayo Procedure Date: 04/20/2016 9:16 AM MRN: [id number] Account #: [id number] Date of Birth: [birth year] Admit Type: Outpatient Age: 75 Room: [facility] ENDO ROOM 1 Gender: Male Note Status: Finalized Procedure:            Colonoscopy Indications:          Personal history of colonic polyps, Follow-up of colitis Providers:            Lollie Sails, MD Referring MD:         [facility], MD (Referring MD) Medicines:            Monitored Anesthesia Care Complications:        No immediate complications. Procedure:            Pre-Anesthesia Assessment:                       - ASA Grade Assessment: III - A patient with severe                        systemic disease.                       After obtaining informed consent, the colonoscope was                        passed under direct vision. Throughout the procedure,                        the patient's blood pressure, pulse, and oxygen                        saturations were monitored continuously. The                        Colonoscope was introduced through the anus and                        advanced to the the cecum, identified by appendiceal                        orifice and ileocecal valve. The colonoscopy was                        performed with moderate difficulty. [Solution]. The                        patient tolerated the procedure well. The quality of                        the bowel preparation was good except the ascending                        colon was poor. Findings:      Three sessile polyps were found in the rectum. The polyps were 1 to 3 mm       in size. These polyps were removed with a cold biopsy forceps. Resection       and retrieval were complete.      A 11 mm polyp was found in the proximal transverse colon. The polyp was       sessile. The polyp was  removed with a cold snare. Resection and       retrieval were complete.      A few  small-mouthed diverticula were found in the sigmoid colon and       distal descending colon.      Biopsies for histology were taken with a cold forceps from the right       colon and left colon for evaluation of microscopic colitis.      The retroflexed view of the distal rectum and anal verge was normal and       showed no anal or rectal abnormalities. Impression:           - Three 1 to 3 mm polyps in the rectum, removed with a                        cold biopsy forceps. Resected and retrieved.                       - One 11 mm polyp in the proximal transverse colon,                        removed with a cold snare. Resected and retrieved.                       - Diverticulosis in the sigmoid colon and in the distal                        descending colon.                       - The distal rectum and anal verge are normal on                        retroflexion view.                       - Biopsies were taken with a cold forceps from the                        right colon and left colon for evaluation of                        microscopic colitis. Recommendation:       - Discharge patient to home.                       - Discharge patient to home.                       - Telephone GI clinic for pathology results in 1 week. Procedure Code(s):    --- Professional ---                       [phone], Colonoscopy, flexible; with removal of tumor(s),                        polyp(s), or other lesion(s) by snare technique                       [id number], 59, Colonoscopy, flexible; with biopsy, single  or multiple Diagnosis Code(s):    --- Professional ---                       K62.1, Rectal polyp                       D12.3, Benign neoplasm of transverse colon (hepatic                        flexure or splenic flexure)                       Z86.010, Personal history of colonic polyps                       K52.9, Noninfective gastroenteritis and colitis,                         unspecified                       K57.30, Diverticulosis of large intestine without                        perforation or abscess without bleeding CPT copyright 2016 American Medical Association. All rights reserved. The codes documented in this report are preliminary and upon coder review may  be revised to meet current compliance requirements. Lollie Sails, MD 04/20/2016 10:08:41 AM This report has been signed electronically. Number of Addenda: 0 Note Initiated On: 04/20/2016 9:16 AM Scope Withdrawal Time: 0 hours 16 minutes 29 seconds  Total Procedure Duration: 0 hours 35 minutes 27 seconds       [facility]

## 2016-04-20 NOTE — Anesthesia Postprocedure Evaluation (Signed)
Anesthesia Post Note  Patient: Nicolas Mayo  Procedure(s) Performed: Procedure(s) (LRB): COLONOSCOPY WITH PROPOFOL (N/A)  Patient location during evaluation: Endoscopy Anesthesia Type: General Level of consciousness: awake and alert Pain management: pain level controlled Vital Signs Assessment: post-procedure vital signs reviewed and stable Respiratory status: spontaneous breathing, nonlabored ventilation, respiratory function stable and patient connected to nasal cannula oxygen Cardiovascular status: blood pressure returned to baseline and stable Postop Assessment: no signs of nausea or vomiting Anesthetic complications: no    Last Vitals:  Vitals:   04/20/16 1029 04/20/16 1039  BP: 101/69 (!) 119/58  Pulse: 97 82  Resp: 18 14  Temp:      Last Pain:  Vitals:   04/20/16 1009  TempSrc: Tympanic                 Precious Haws Raenah Murley

## 2016-04-24 ENCOUNTER — Encounter: Payer: Self-pay | Admitting: Gastroenterology

## 2016-04-24 LAB — SURGICAL PATHOLOGY

## 2016-12-25 ENCOUNTER — Other Ambulatory Visit: Payer: Self-pay | Admitting: Gastroenterology

## 2016-12-25 DIAGNOSIS — K760 Fatty (change of) liver, not elsewhere classified: Secondary | ICD-10-CM

## 2017-01-02 ENCOUNTER — Ambulatory Visit
Admission: RE | Admit: 2017-01-02 | Discharge: 2017-01-02 | Disposition: A | Discharge status: Home / Self Care | Payer: Medicare Other | Source: Ambulatory Visit | Attending: Gastroenterology | Admitting: Gastroenterology

## 2017-01-02 ENCOUNTER — Encounter: Payer: Self-pay | Admitting: *Deleted

## 2017-01-02 ENCOUNTER — Ambulatory Visit: Payer: Medicare Other | Admitting: Anesthesiology

## 2017-01-02 ENCOUNTER — Encounter
Admission: RE | Disposition: A | Discharge status: Home / Self Care | Payer: Self-pay | Source: Ambulatory Visit | Attending: Gastroenterology

## 2017-01-02 DIAGNOSIS — K3189 Other diseases of stomach and duodenum: Secondary | ICD-10-CM | POA: Insufficient documentation

## 2017-01-02 DIAGNOSIS — K219 Gastro-esophageal reflux disease without esophagitis: Secondary | ICD-10-CM | POA: Diagnosis not present

## 2017-01-02 DIAGNOSIS — E119 Type 2 diabetes mellitus without complications: Secondary | ICD-10-CM | POA: Insufficient documentation

## 2017-01-02 DIAGNOSIS — Z86010 Personal history of colonic polyps: Secondary | ICD-10-CM | POA: Diagnosis not present

## 2017-01-02 DIAGNOSIS — F17200 Nicotine dependence, unspecified, uncomplicated: Secondary | ICD-10-CM | POA: Insufficient documentation

## 2017-01-02 DIAGNOSIS — Z79899 Other long term (current) drug therapy: Secondary | ICD-10-CM | POA: Insufficient documentation

## 2017-01-02 DIAGNOSIS — B3781 Candidal esophagitis: Secondary | ICD-10-CM | POA: Insufficient documentation

## 2017-01-02 DIAGNOSIS — I10 Essential (primary) hypertension: Secondary | ICD-10-CM | POA: Diagnosis not present

## 2017-01-02 DIAGNOSIS — K2970 Gastritis, unspecified, without bleeding: Secondary | ICD-10-CM | POA: Diagnosis not present

## 2017-01-02 DIAGNOSIS — Z8719 Personal history of other diseases of the digestive system: Secondary | ICD-10-CM | POA: Insufficient documentation

## 2017-01-02 DIAGNOSIS — Z856 Personal history of leukemia: Secondary | ICD-10-CM | POA: Diagnosis not present

## 2017-01-02 DIAGNOSIS — D132 Benign neoplasm of duodenum: Secondary | ICD-10-CM | POA: Insufficient documentation

## 2017-01-02 HISTORY — DX: Polyp of colon: K63.5

## 2017-01-02 HISTORY — PX: ESOPHAGOGASTRODUODENOSCOPY (EGD) WITH PROPOFOL: SHX5813

## 2017-01-02 HISTORY — DX: Esophagitis, unspecified without bleeding: K20.90

## 2017-01-02 HISTORY — DX: Esophagitis, unspecified: K20.9

## 2017-01-02 LAB — CBC WITH DIFFERENTIAL/PLATELET
BASOS PCT: 1 %
Basophils Absolute: 0.1 10*3/uL (ref 0–0.1)
EOS ABS: 0.2 10*3/uL (ref 0–0.7)
EOS PCT: 4 %
HCT: 45.9 % (ref 40.0–52.0)
Hemoglobin: 15.6 g/dL (ref 13.0–18.0)
LYMPHS ABS: 1.7 10*3/uL (ref 1.0–3.6)
Lymphocytes Relative: 24 %
MCH: 34.7 pg — AB (ref 26.0–34.0)
MCHC: 34.1 g/dL (ref 32.0–36.0)
MCV: 101.9 fL — ABNORMAL HIGH (ref 80.0–100.0)
MONO ABS: 0.7 10*3/uL (ref 0.2–1.0)
MONOS PCT: 10 %
NEUTROS PCT: 61 %
Neutro Abs: 4.2 10*3/uL (ref 1.4–6.5)
PLATELETS: 216 10*3/uL (ref 150–440)
RBC: 4.5 MIL/uL (ref 4.40–5.90)
RDW: 14.8 % — AB (ref 11.5–14.5)
WBC: 6.9 10*3/uL (ref 3.8–10.6)

## 2017-01-02 LAB — PROTIME-INR
INR: 0.94
PROTHROMBIN TIME: 12.6 s (ref 11.4–15.2)

## 2017-01-02 SURGERY — ESOPHAGOGASTRODUODENOSCOPY (EGD) WITH PROPOFOL
Anesthesia: General

## 2017-01-02 MED ORDER — SODIUM CHLORIDE 0.9 % IV SOLN: INTRAVENOUS | Status: DC

## 2017-01-02 MED ORDER — SODIUM CHLORIDE 0.9 % IV SOLN
INTRAVENOUS | Status: DC
  Administered 2017-01-02: 09:00:00 via INTRAVENOUS

## 2017-01-02 MED ORDER — PROPOFOL 500 MG/50ML IV EMUL
INTRAVENOUS | Status: DC | PRN
  Administered 2017-01-02: 100 ug/kg/min via INTRAVENOUS

## 2017-01-02 MED ORDER — FENTANYL CITRATE (PF) 100 MCG/2ML IJ SOLN
INTRAMUSCULAR | Status: DC | PRN
  Administered 2017-01-02: 50 ug via INTRAVENOUS

## 2017-01-02 MED ORDER — LIDOCAINE HCL (PF) 2 % IJ SOLN
INTRAMUSCULAR | Status: AC
  Filled 2017-01-02: qty 2

## 2017-01-02 MED ORDER — GLYCOPYRROLATE 0.2 MG/ML IJ SOLN
INTRAMUSCULAR | Status: AC
  Filled 2017-01-02: qty 1

## 2017-01-02 MED ORDER — PROPOFOL 500 MG/50ML IV EMUL
INTRAVENOUS | Status: AC
  Filled 2017-01-02: qty 50

## 2017-01-02 MED ORDER — MIDAZOLAM HCL 2 MG/2ML IJ SOLN
INTRAMUSCULAR | Status: DC | PRN
Start: 2017-01-02 — End: 2017-01-02
  Administered 2017-01-02 (×2): 1 mg via INTRAVENOUS

## 2017-01-02 MED ORDER — LIDOCAINE HCL (CARDIAC) 20 MG/ML IV SOLN
INTRAVENOUS | Status: DC | PRN
  Administered 2017-01-02: 30 mg via INTRAVENOUS

## 2017-01-02 MED ORDER — GLYCOPYRROLATE 0.2 MG/ML IJ SOLN
INTRAMUSCULAR | Status: DC | PRN
  Administered 2017-01-02: 0.1 mg via INTRAVENOUS

## 2017-01-02 MED ORDER — MIDAZOLAM HCL 2 MG/2ML IJ SOLN
INTRAMUSCULAR | Status: AC
  Filled 2017-01-02: qty 2

## 2017-01-02 MED ORDER — FENTANYL CITRATE (PF) 100 MCG/2ML IJ SOLN
INTRAMUSCULAR | Status: AC
  Filled 2017-01-02: qty 2

## 2017-01-02 NOTE — Anesthesia Post-op Follow-up Note (Cosign Needed)
Anesthesia QCDR form completed.        

## 2017-01-02 NOTE — Transfer of Care (Signed)
Immediate Anesthesia Transfer of Care Note  Patient: Nicolas Mayo  Procedure(s) Performed: Procedure(s): ESOPHAGOGASTRODUODENOSCOPY (EGD) WITH PROPOFOL (N/A)  Patient Location: PACU  Anesthesia Type:General  Level of Consciousness: awake and sedated  Airway & Oxygen Therapy: Patient Spontanous Breathing and Patient connected to nasal cannula oxygen  Post-op Assessment: Report given to RN and Post -op Vital signs reviewed and stable  Post vital signs: Reviewed and stable  Last Vitals:  Vitals:   01/02/17 0850  BP: (!) 145/78  Pulse: 70  Resp: 14  Temp: (!) 36.2 C    Last Pain:  Vitals:   01/02/17 0850  TempSrc: Oral  PainSc: 3          Complications: No apparent anesthesia complications

## 2017-01-02 NOTE — Anesthesia Procedure Notes (Signed)
Performed by: COOK-MARTIN, Brayleigh Rybacki Pre-anesthesia Checklist: Patient identified, Emergency Drugs available, Suction available, Patient being monitored and Timeout performed Patient Re-evaluated:Patient Re-evaluated prior to inductionOxygen Delivery Method: Nasal cannula Preoxygenation: Pre-oxygenation with 100% oxygen Intubation Type: IV induction Airway Equipment and Method: Bite block Placement Confirmation: CO2 detector and positive ETCO2     

## 2017-01-02 NOTE — H&P (Signed)
Outpatient short stay form Pre-procedure 01/02/2017 10:50 AM Nicolas Sails MD  Primary Physician: Milinda Cave NP  Reason for visit:  EGD  History of present illness:  Patient is a 76 year old male presenting today for an EGD. 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 His last procedure showed no recurrence however is presenting today for a recheck. He takes no thinners or aspirin products.    Current Facility-Administered Medications:  .  0.9 %  sodium chloride infusion, , Intravenous, Continuous, Nicolas Sails, MD, Last Rate: 20 mL/hr at 01/02/17 0909 .  0.9 %  sodium chloride infusion, , Intravenous, Continuous, Nicolas Sails, MD  Prescriptions Prior to Admission  Medication Sig Dispense Refill Last Dose  . imatinib (GLEEVEC) 100 MG tablet Take 300 mg by mouth daily. Reported on 01/10/2016   01/01/2017 at Unknown time  . Multiple Vitamins-Minerals (PRESERVISION AREDS 2 PO) Take 2 capsules by mouth daily.   Past Week at Unknown time  . pantoprazole (PROTONIX) 40 MG tablet Take 40 mg by mouth daily. Reported on 01/11/2016   01/01/2017 at Unknown time  . valsartan (DIOVAN) 80 MG tablet Take 80 mg by mouth daily.   01/02/2017 at Unknown time  . vitamin B-12 (CYANOCOBALAMIN) 1000 MCG tablet Take 1,000 mcg by mouth daily.   Past Week at Unknown time     No Known Allergies   Past Medical History:  Diagnosis Date  . Blood dyscrasia   . Cancer ([place])    leukemia  . Colon polyp   . Diabetes mellitus without complication ([place])   . Diverticulosis   . Diverticulosis   . Duodenitis   . Duodenitis   . Duodenitis   . Esophagitis   . Gastropathy   . Gastropathy   . GERD (gastroesophageal reflux disease)   . Schatzki's ring 08/18/2014  . Schatzki's ring   . Tubulovillous adenoma polyp of colon     Review of systems:      Physical Exam    Heart and lungs: Regular rate and rhythm without rub or gallop, lungs are  bilaterally clear.    HEENT: Normocephalic atraumatic eyes are anicteric    Other:     Pertinant exam for procedure: Soft nontender nondistended bowel sounds positive normoactive.    Planned proceedures: EGD and indicated procedures. I have discussed the risks benefits and complications of procedures to include not limited to bleeding, infection, perforation and the risk of sedation and the patient wishes to proceed. CBC checked today indicated a white count of 6.9 with neutrophil 61% platelet count of 216, and a INR 0.94    Nicolas Sails, MD Gastroenterology 01/02/2017  10:50 AM

## 2017-01-02 NOTE — Op Note (Signed)
[facility] Gastroenterology Patient Name: Nicolas Mayo Procedure Date: 01/02/2017 10:50 AM MRN: [id number] Account #: [id number] Date of Birth: January 07, 1941 Admit Type: Outpatient Age: 76 Room: [facility] ENDO ROOM 3 Gender: Male Note Status: Finalized Procedure:            Upper GI endoscopy Indications:          Surveillance procedure, follow up duodenal adenoma Providers:            Lollie Sails, MD Referring MD:         No Local Md, MD (Referring MD) Medicines:            Monitored Anesthesia Care Complications:        No immediate complications. Procedure:            Pre-Anesthesia Assessment:                       - ASA Grade Assessment: III - A patient with severe                        systemic disease.                       After obtaining informed consent, the endoscope was                        passed under direct vision. Throughout the procedure,                        the patient's blood pressure, pulse, and oxygen                        saturations were monitored continuously. The Endoscope                        was introduced through the mouth, and advanced to the                        third part of duodenum. The upper GI endoscopy was                        accomplished without difficulty. The patient tolerated                        the procedure well. Findings:      The Z-line was irregular. Biopsies were taken with a cold forceps for       histology. Biopsies were taken with a cold forceps for histology.      Minimal patchy candidiasis was found in the middle third of the       esophagus and in the lower third of the esophagus.      The exam of the esophagus was otherwise normal.      Diffuse moderate inflammation characterized by congestion (edema) and       erythema was found in the entire examined stomach.      The cardia and gastric fundus were normal on retroflexion.      A medium-sized polypoid mass, consistent with adenoma, with no  bleeding       was found in the third portion of the duodenum. Biopsies were taken with       a cold forceps for histology.  Impression:           - Z-line irregular. Biopsied.                       - Monilial esophagitis.                       - Bile gastritis.                       - Mass (suspected adenoma) in the third portion of the                        duodenum. Biopsied. Recommendation:       - Discharge patient to home.                       - Await pathology results.                       - Perform an upper endoscopic ultrasound (UEUS) at                        appointment to be scheduled.                       - Mycelex (clotrimazole) 10 mg lozenge 5x/day for 1                        week. Procedure Code(s):    --- Professional ---                       [phone], Esophagogastroduodenoscopy, flexible, transoral;                        with biopsy, single or multiple Diagnosis Code(s):    --- Professional ---                       K22.8, Other specified diseases of esophagus                       B37.81, Candidal esophagitis                       K29.60, Other gastritis without bleeding                       K31.89, Other diseases of stomach and duodenum CPT copyright 2016 American Medical Association. All rights reserved. The codes documented in this report are preliminary and upon coder review may  be revised to meet current compliance requirements. Lollie Sails, MD 01/02/2017 11:25:53 AM This report has been signed electronically. Number of Addenda: 0 Note Initiated On: 01/02/2017 10:50 AM      [facility]

## 2017-01-02 NOTE — Anesthesia Preprocedure Evaluation (Signed)
Anesthesia Evaluation  Patient identified by MRN, date of birth, ID band Patient awake    Reviewed: Allergy & Precautions, NPO status , Patient's Chart, lab work & pertinent test results  History of Anesthesia Complications Negative for: history of anesthetic complications  Airway Mallampati: II  TM Distance: >3 FB Neck ROM: Full    Dental  (+) Edentulous Upper, Edentulous Lower   Pulmonary neg sleep apnea, neg COPD, Current Smoker,    breath sounds clear to auscultation- rhonchi (-) wheezing      Cardiovascular Exercise Tolerance: Good hypertension, Pt. on medications (-) CAD, (-) Past MI and (-) Cardiac Stents  Rhythm:Regular Rate:Normal - Systolic murmurs and - Diastolic murmurs    Neuro/Psych negative neurological ROS  negative psych ROS   GI/Hepatic Neg liver ROS, GERD  ,  Endo/Other  diabetes (diet controlled)  Renal/GU negative Renal ROS     Musculoskeletal negative musculoskeletal ROS (+)   Abdominal (+) + obese,   Peds  Hematology negative hematology ROS (+)   Anesthesia Other Findings Past Medical History: No date: Blood dyscrasia No date: Cancer (HCC)     Comment: leukemia No date: Colon polyp No date: Diabetes mellitus without complication ([facility]) No date: Diverticulosis No date: Diverticulosis No date: Duodenitis No date: Duodenitis No date: Duodenitis No date: Esophagitis No date: Gastropathy No date: Gastropathy No date: GERD (gastroesophageal reflux disease) 08/18/2014: Schatzki's ring No date: Schatzki's ring No date: Tubulovillous adenoma polyp of colon   Reproductive/Obstetrics                             Anesthesia Physical Anesthesia Plan  ASA: III  Anesthesia Plan: General   Post-op Pain Management:    Induction: Intravenous  PONV Risk Score and Plan: 1 and Propofol  Airway Management Planned: Natural Airway  Additional Equipment:   Intra-op  Plan:   Post-operative Plan:   Informed Consent: I have reviewed the patients History and Physical, chart, labs and discussed the procedure including the risks, benefits and alternatives for the proposed anesthesia with the patient or authorized representative who has indicated his/her understanding and acceptance.   Dental advisory given  Plan Discussed with: CRNA and Anesthesiologist  Anesthesia Plan Comments:         Anesthesia Quick Evaluation

## 2017-01-02 NOTE — Anesthesia Postprocedure Evaluation (Signed)
Anesthesia Post Note  Patient: Raidyn Wassink Schellinger  Procedure(s) Performed: Procedure(s) (LRB): ESOPHAGOGASTRODUODENOSCOPY (EGD) WITH PROPOFOL (N/A)  Patient location during evaluation: Endoscopy Anesthesia Type: General Level of consciousness: awake and alert and oriented Pain management: pain level controlled Vital Signs Assessment: post-procedure vital signs reviewed and stable Respiratory status: spontaneous breathing, nonlabored ventilation and respiratory function stable Cardiovascular status: blood pressure returned to baseline and stable Postop Assessment: no signs of nausea or vomiting Anesthetic complications: no     Last Vitals:  Vitals:   01/02/17 1140 01/02/17 1150  BP: 129/71 (!) 149/83  Pulse: 86 78  Resp: 14 15  Temp:      Last Pain:  Vitals:   01/02/17 1120  TempSrc: Tympanic  PainSc:                  Sidi Dzikowski

## 2017-01-03 ENCOUNTER — Encounter: Payer: Self-pay | Admitting: Gastroenterology

## 2017-01-03 LAB — SURGICAL PATHOLOGY

## 2017-01-04 ENCOUNTER — Telehealth: Payer: Self-pay

## 2017-01-04 NOTE — Telephone Encounter (Signed)
  Oncology Nurse Navigator Documentation Received referral for EUS asap for duodenal adenoma. No availability at [facility]. Referral sent to [facility]. Mr. Seeberger made aware. [facility] will call him with date/time/instructions. Navigator Location: [facility] (01/04/17 1000)   )Navigator Encounter Type: Telephone (01/04/17 1000)                         Barriers/Navigation Needs: Coordination of Care (01/04/17 1000)   Interventions: Coordination of Care (01/04/17 1000)   Coordination of Care: EUS (01/04/17 1000)                  Time Spent with Patient: 30 (01/04/17 1000)

## 2017-01-08 ENCOUNTER — Ambulatory Visit
Admission: RE | Admit: 2017-01-08 | Discharge: 2017-01-08 | Disposition: A | Discharge status: Home / Self Care | Payer: Medicare Other | Source: Ambulatory Visit | Attending: Gastroenterology | Admitting: Gastroenterology

## 2017-01-08 DIAGNOSIS — K8020 Calculus of gallbladder without cholecystitis without obstruction: Secondary | ICD-10-CM | POA: Insufficient documentation

## 2017-01-08 DIAGNOSIS — K760 Fatty (change of) liver, not elsewhere classified: Secondary | ICD-10-CM | POA: Insufficient documentation

## 2017-07-27 ENCOUNTER — Encounter: Payer: Self-pay | Admitting: *Deleted

## 2017-07-30 ENCOUNTER — Encounter
Admission: RE | Disposition: A | Discharge status: Home / Self Care | Payer: Self-pay | Source: Ambulatory Visit | Attending: Gastroenterology

## 2017-07-30 ENCOUNTER — Ambulatory Visit: Payer: Medicare Other | Admitting: Anesthesiology

## 2017-07-30 ENCOUNTER — Encounter: Payer: Self-pay | Admitting: Anesthesiology

## 2017-07-30 ENCOUNTER — Ambulatory Visit
Admission: RE | Admit: 2017-07-30 | Discharge: 2017-07-30 | Disposition: A | Discharge status: Home / Self Care | Payer: Medicare Other | Source: Ambulatory Visit | Attending: Gastroenterology | Admitting: Gastroenterology

## 2017-07-30 DIAGNOSIS — Z79899 Other long term (current) drug therapy: Secondary | ICD-10-CM | POA: Insufficient documentation

## 2017-07-30 DIAGNOSIS — K228 Other specified diseases of esophagus: Secondary | ICD-10-CM | POA: Diagnosis not present

## 2017-07-30 DIAGNOSIS — K3189 Other diseases of stomach and duodenum: Secondary | ICD-10-CM | POA: Diagnosis not present

## 2017-07-30 DIAGNOSIS — K219 Gastro-esophageal reflux disease without esophagitis: Secondary | ICD-10-CM | POA: Diagnosis not present

## 2017-07-30 DIAGNOSIS — F17200 Nicotine dependence, unspecified, uncomplicated: Secondary | ICD-10-CM | POA: Diagnosis not present

## 2017-07-30 DIAGNOSIS — E119 Type 2 diabetes mellitus without complications: Secondary | ICD-10-CM | POA: Diagnosis not present

## 2017-07-30 DIAGNOSIS — D132 Benign neoplasm of duodenum: Secondary | ICD-10-CM | POA: Diagnosis not present

## 2017-07-30 HISTORY — PX: ESOPHAGOGASTRODUODENOSCOPY (EGD) WITH PROPOFOL: SHX5813

## 2017-07-30 LAB — CBC WITH DIFFERENTIAL/PLATELET
BASOS PCT: 1 %
Basophils Absolute: 0.1 10*3/uL (ref 0–0.1)
Eosinophils Absolute: 0.5 10*3/uL (ref 0–0.7)
Eosinophils Relative: 8 %
HEMATOCRIT: 47.8 % (ref 40.0–52.0)
Hemoglobin: 16.4 g/dL (ref 13.0–18.0)
LYMPHS ABS: 1.6 10*3/uL (ref 1.0–3.6)
LYMPHS PCT: 24 %
MCH: 35.6 pg — AB (ref 26.0–34.0)
MCHC: 34.3 g/dL (ref 32.0–36.0)
MCV: 103.8 fL — AB (ref 80.0–100.0)
MONO ABS: 0.7 10*3/uL (ref 0.2–1.0)
MONOS PCT: 10 %
NEUTROS ABS: 3.8 10*3/uL (ref 1.4–6.5)
NEUTROS PCT: 57 %
Platelets: 223 10*3/uL (ref 150–440)
RBC: 4.61 MIL/uL (ref 4.40–5.90)
RDW: 15 % — ABNORMAL HIGH (ref 11.5–14.5)
WBC: 6.7 10*3/uL (ref 3.8–10.6)

## 2017-07-30 LAB — PROTIME-INR
INR: 0.99
Prothrombin Time: 13 seconds (ref 11.4–15.2)

## 2017-07-30 SURGERY — ESOPHAGOGASTRODUODENOSCOPY (EGD) WITH PROPOFOL
Anesthesia: General

## 2017-07-30 MED ORDER — PROPOFOL 10 MG/ML IV BOLUS
INTRAVENOUS | Status: DC | PRN
  Administered 2017-07-30: 80 mg via INTRAVENOUS

## 2017-07-30 MED ORDER — PROPOFOL 500 MG/50ML IV EMUL
INTRAVENOUS | Status: AC
  Filled 2017-07-30: qty 50

## 2017-07-30 MED ORDER — SODIUM CHLORIDE 0.9 % IV SOLN: INTRAVENOUS | Status: DC

## 2017-07-30 MED ORDER — IPRATROPIUM-ALBUTEROL 0.5-2.5 (3) MG/3ML IN SOLN
3.0000 mL | Freq: Four times a day (QID) | RESPIRATORY_TRACT | Status: DC | PRN
  Administered 2017-07-30: 3 mL via RESPIRATORY_TRACT

## 2017-07-30 MED ORDER — PROPOFOL 10 MG/ML IV BOLUS
INTRAVENOUS | Status: AC
  Filled 2017-07-30: qty 20

## 2017-07-30 MED ORDER — FENTANYL CITRATE (PF) 100 MCG/2ML IJ SOLN: 25.0000 ug | INTRAMUSCULAR | Status: DC | PRN

## 2017-07-30 MED ORDER — IPRATROPIUM-ALBUTEROL 0.5-2.5 (3) MG/3ML IN SOLN
RESPIRATORY_TRACT | Status: AC
  Administered 2017-07-30: 3 mL via RESPIRATORY_TRACT
  Filled 2017-07-30: qty 3

## 2017-07-30 MED ORDER — SODIUM CHLORIDE 0.9 % IV SOLN
INTRAVENOUS | Status: DC
  Administered 2017-07-30: 10:00:00 via INTRAVENOUS

## 2017-07-30 MED ORDER — PROPOFOL 500 MG/50ML IV EMUL
INTRAVENOUS | Status: DC | PRN
  Administered 2017-07-30: 150 ug/kg/min via INTRAVENOUS

## 2017-07-30 MED ORDER — GLUCAGON HCL RDNA (DIAGNOSTIC) 1 MG IJ SOLR
INTRAMUSCULAR | Status: DC | PRN
  Administered 2017-07-30: .5 mg via INTRAVENOUS

## 2017-07-30 MED ORDER — ONDANSETRON HCL 4 MG/2ML IJ SOLN: 4.0000 mg | Freq: Once | INTRAMUSCULAR | Status: DC | PRN

## 2017-07-30 MED ORDER — GLUCAGON HCL RDNA (DIAGNOSTIC) 1 MG IJ SOLR
INTRAMUSCULAR | Status: AC
  Filled 2017-07-30: qty 1

## 2017-07-30 NOTE — Anesthesia Postprocedure Evaluation (Signed)
Anesthesia Post Note  Patient: Nicolas Mayo  Procedure(s) Performed: ESOPHAGOGASTRODUODENOSCOPY (EGD) WITH PROPOFOL (N/A )  Patient location during evaluation: PACU Anesthesia Type: General Level of consciousness: awake and alert and oriented Pain management: pain level controlled Vital Signs Assessment: post-procedure vital signs reviewed and stable Respiratory status: spontaneous breathing Cardiovascular status: blood pressure returned to baseline Anesthetic complications: no     Last Vitals:  Vitals:   07/30/17 1040 07/30/17 1050  BP: 126/72 127/77  Pulse: 72 63  Resp: 20 20  Temp:    SpO2: 100% 100%    Last Pain:  Vitals:   07/30/17 1020  TempSrc: Tympanic                 Sahra Converse

## 2017-07-30 NOTE — Transfer of Care (Signed)
Immediate Anesthesia Transfer of Care Note  Patient: Nicolas Mayo  Procedure(s) Performed: ESOPHAGOGASTRODUODENOSCOPY (EGD) WITH PROPOFOL (N/A )  Patient Location: PACU  Anesthesia Type:General  Level of Consciousness: awake and sedated  Airway & Oxygen Therapy: Patient Spontanous Breathing and Patient connected to nasal cannula oxygen  Post-op Assessment: Report given to RN and Post -op Vital signs reviewed and stable  Post vital signs: Reviewed and stable  Last Vitals:  Vitals:   07/30/17 0841 07/30/17 1020  BP: (!) 149/75   Pulse: 71 78  Resp: 16 20  Temp: (!) 36.3 C (!) 36.1 C  SpO2: 99% 92%    Last Pain:  Vitals:   07/30/17 1020  TempSrc: Tympanic         Complications: No apparent anesthesia complications

## 2017-07-30 NOTE — Anesthesia Post-op Follow-up Note (Signed)
Anesthesia QCDR form completed.        

## 2017-07-30 NOTE — Anesthesia Preprocedure Evaluation (Signed)
Anesthesia Evaluation  Patient identified by MRN, date of birth, ID band Patient awake    Reviewed: Allergy & Precautions, NPO status , Patient's Chart, lab work & pertinent test results  History of Anesthesia Complications Negative for: history of anesthetic complications  Airway Mallampati: II  TM Distance: >3 FB Neck ROM: Full    Dental  (+) Edentulous Upper, Edentulous Lower   Pulmonary neg sleep apnea, neg COPD, Current Smoker,    breath sounds clear to auscultation- rhonchi (-) wheezing      Cardiovascular Exercise Tolerance: Good hypertension, Pt. on medications (-) CAD, (-) Past MI and (-) Cardiac Stents  Rhythm:Regular Rate:Normal - Systolic murmurs and - Diastolic murmurs    Neuro/Psych negative neurological ROS  negative psych ROS   GI/Hepatic Neg liver ROS, GERD  ,  Endo/Other  diabetes  Renal/GU negative Renal ROS     Musculoskeletal negative musculoskeletal ROS (+)   Abdominal (+) + obese,   Peds  Hematology negative hematology ROS (+)   Anesthesia Other Findings Past Medical History: No date: Blood dyscrasia No date: Cancer (HCC)     Comment: leukemia No date: Colon polyp No date: Diabetes mellitus without complication (HCC) No date: Diverticulosis No date: Diverticulosis No date: Duodenitis No date: Duodenitis No date: Duodenitis No date: Esophagitis No date: Gastropathy No date: Gastropathy No date: GERD (gastroesophageal reflux disease) 08/18/2014: Schatzki's ring No date: Schatzki's ring No date: Tubulovillous adenoma polyp of colon   Reproductive/Obstetrics                             Anesthesia Physical  Anesthesia Plan  ASA: III  Anesthesia Plan: General   Post-op Pain Management:    Induction: Intravenous  PONV Risk Score and Plan: 1 and Propofol  Airway Management Planned: Natural Airway and Nasal Cannula  Additional Equipment:   Intra-op  Plan:   Post-operative Plan:   Informed Consent: I have reviewed the patients History and Physical, chart, labs and discussed the procedure including the risks, benefits and alternatives for the proposed anesthesia with the patient or authorized representative who has indicated his/her understanding and acceptance.   Dental advisory given  Plan Discussed with: CRNA and Anesthesiologist  Anesthesia Plan Comments:         Anesthesia Quick Evaluation

## 2017-07-30 NOTE — H&P (Signed)
Outpatient short stay form Pre-procedure 07/30/2017 9:12 AM Lollie Sails MD  Primary Physician: Dr. Lamont Snowball  Reason for visit: Patient is a 77 year old male presenting today for an EGD in regards to his personal history of duodenal adenoma.  He had this removed following endoscopic ultrasound on 01/10/2017 and is presenting today for a 35-month follow-up.  He takes no aspirin or blood thinning agent.  History of present illness: As above    Current Facility-Administered Medications:  .  0.9 %  sodium chloride infusion, , Intravenous, Continuous, Lollie Sails, MD .  0.9 %  sodium chloride infusion, , Intravenous, Continuous, Lollie Sails, MD  Medications Prior to Admission  Medication Sig Dispense Refill Last Dose  . cholecalciferol (VITAMIN D) 1000 units tablet Take 1,000 Units by mouth daily.   07/29/2017 at Unknown time  . imatinib (GLEEVEC) 100 MG tablet Take 300 mg by mouth daily. Reported on 01/10/2016   07/29/2017 at Unknown time  . Multiple Vitamins-Minerals (PRESERVISION AREDS 2 PO) Take 2 capsules by mouth daily.   07/29/2017 at Unknown time  . pantoprazole (PROTONIX) 40 MG tablet Take 40 mg by mouth daily. Reported on 01/11/2016   07/29/2017 at Unknown time  . valsartan (DIOVAN) 40 MG tablet Take 40 mg by mouth daily.    07/29/2017 at Unknown time  . vitamin B-12 (CYANOCOBALAMIN) 1000 MCG tablet Take 1,000 mcg by mouth daily.   07/29/2017 at Unknown time     No Known Allergies   Past Medical History:  Diagnosis Date  . Blood dyscrasia   . Cancer ([place])    leukemia  . Colon polyp   . Diabetes mellitus without complication ([place])   . Diverticulosis   . Diverticulosis   . Duodenitis   . Duodenitis   . Duodenitis   . Esophagitis   . Gastropathy   . Gastropathy   . GERD (gastroesophageal reflux disease)   . Schatzki's ring 08/18/2014  . Schatzki's ring   . Tubulovillous adenoma polyp of colon     Review of systems:      Physical Exam    Heart and  lungs: Regular rate and rhythm without rub or gallop, lungs are bilaterally clear.    HEENT: Normocephalic atraumatic eyes are anicteric    Other:    Pertinant exam for procedure: Soft nontender protuberant bowel sounds positive normoactive    Planned proceedures: EGD and indicated procedures. I have discussed the risks benefits and complications of procedures to include not limited to bleeding, infection, perforation and the risk of sedation and the patient wishes to proceed.    Lollie Sails, MD Gastroenterology 07/30/2017  9:12 AM

## 2017-07-30 NOTE — Anesthesia Procedure Notes (Signed)
Date/Time: 07/30/2017 9:49 AM Performed by: Nelda Marseille, CRNA Pre-anesthesia Checklist: Patient identified, Emergency Drugs available, Suction available, Patient being monitored and Timeout performed Oxygen Delivery Method: Nasal cannula

## 2017-07-30 NOTE — Op Note (Signed)
[facility] Gastroenterology Patient Name: Nicolas Mayo Procedure Date: 07/30/2017 9:08 AM MRN: [id number] Account #: [id number] Date of Birth: 1941/01/12 Admit Type: Outpatient Age: 77 Room: [facility] ENDO ROOM 1 Gender: Male Note Status: Finalized Procedure:            Upper GI endoscopy Indications:          Surveillance procedure, follow up duodenal adenoma Providers:            Lollie Sails, MD Referring MD:         [facility] Medicines:            Monitored Anesthesia Care Complications:        No immediate complications. Procedure:            Pre-Anesthesia Assessment:                       - ASA Grade Assessment: III - A patient with severe                        systemic disease.                       After obtaining informed consent, the endoscope was                        passed under direct vision. Throughout the procedure,                        the patient's blood pressure, pulse, and oxygen                        saturations were monitored continuously. The Endoscope                        was introduced through the mouth, and advanced to the                        third part of duodenum. The patient tolerated the                        procedure well. Findings:      The Z-line was variable. Biopsies were taken with a cold forceps for       histology.      The exam of the esophagus was otherwise normal.      Patchy mildly erythematous mucosa without bleeding was found in the       gastric body and in the gastric antrum. Biopsies were taken with a cold       forceps for histology. Biopsies were taken with a cold forceps for       Helicobacter pylori testing.      Two small to medium frond-like/villous and polypoid mass, consistent       with adenoma, with [Bleeding] was found in the second portion of the       duodenum and in the third portion of the duodenum. The lesion in the       third portion was about 4 mm and removed with passes of a cold forcep.        The one in the second portion was larger, going also behind the fold,       possibly up to 2 cm. multiple  biopsies taken. The major papilla was       located, however the back side of the lesion had the appearance of a       minor duct opening.      The exam of the duodenum was otherwise normal.      The cardia and gastric fundus were normal on retroflexion, note small       hiatal hernia. Impression:           - Z-line variable. Biopsied.                       - Erythematous mucosa in the gastric body and antrum.                        Biopsied.                       - Mass (suspected adenoma) in the third portion of the                        duodenum and in the second portion of the duodenum. Recommendation:       - Await pathology results.                       - Perform an upper endoscopic ultrasound (UEUS) at                        appointment to be scheduled. Procedure Code(s):    --- Professional ---                       [phone], Esophagogastroduodenoscopy, flexible, transoral;                        with biopsy, single or multiple Diagnosis Code(s):    --- Professional ---                       K22.8, Other specified diseases of esophagus                       K31.89, Other diseases of stomach and duodenum CPT copyright 2016 [employer]. All rights reserved. The codes documented in this report are preliminary and upon coder review may  be revised to meet current compliance requirements. Lollie Sails, MD 07/30/2017 10:27:40 AM This report has been signed electronically. Number of Addenda: 0 Note Initiated On: 07/30/2017 9:08 AM Total Procedure Duration: 0 hours 32 minutes 12 seconds       [facility]

## 2017-07-31 ENCOUNTER — Encounter: Payer: Self-pay | Admitting: Gastroenterology

## 2017-08-01 LAB — SURGICAL PATHOLOGY

## 2018-03-18 ENCOUNTER — Encounter: Payer: Self-pay | Admitting: Podiatry

## 2018-03-18 ENCOUNTER — Ambulatory Visit (INDEPENDENT_AMBULATORY_CARE_PROVIDER_SITE_OTHER): Payer: No Typology Code available for payment source | Admitting: Podiatry

## 2018-03-18 VITALS — BP 144/77 | HR 83

## 2018-03-18 DIAGNOSIS — B351 Tinea unguium: Secondary | ICD-10-CM

## 2018-03-18 DIAGNOSIS — E1142 Type 2 diabetes mellitus with diabetic polyneuropathy: Secondary | ICD-10-CM

## 2018-03-18 DIAGNOSIS — M79675 Pain in left toe(s): Secondary | ICD-10-CM

## 2018-03-18 DIAGNOSIS — M79674 Pain in right toe(s): Secondary | ICD-10-CM

## 2018-03-18 NOTE — Progress Notes (Signed)
This patient presents to the office with chief complaint of long thick nails and diabetic feet.  This patient  says there  is  no pain and discomfort in their feet.  This patient says there are long thick painful nails.  These nails are painful walking and wearing shoes.  Patient has no history of infection or drainage from both feet.  Patient is unable to  self treat his own nails . This patient presents  to the office today for treatment of the  long nails and a foot evaluation due to history of  diabetes.  General Appearance  Alert, conversant and in no acute stress.  Vascular  Dorsalis pedis and posterior tibial  pulses are  palpable  bilaterally.  Capillary return is within normal limits  bilaterally. Temperature is within normal limits  bilaterally.  Neurologic  Senn-Weinstein monofilament wire test absent   bilaterally. Muscle power within normal limits bilaterally.  Nails Thick disfigured discolored nails with subungual debris  from hallux to fifth toes bilaterally. No evidence of bacterial infection or drainage bilaterally.  Orthopedic  No limitations of motion of motion feet .  No crepitus or effusions noted.  HAV  B/L.  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  No signs of infections or ulcers noted.     Onychomycosis  Diabetes with neuropathy.  IE  Debride nails x 10.  A diabetic foot exam was performed and there is no evidence of any vascular pathology.  Neurologic pathology noted  B/L.   RTC 3 months.   Gardiner Barefoot DPM

## 2018-05-07 ENCOUNTER — Other Ambulatory Visit: Payer: Self-pay

## 2018-05-07 ENCOUNTER — Encounter: Payer: Non-veteran care | Attending: Family Medicine

## 2018-05-07 VITALS — Ht 72.0 in | Wt 217.3 lb

## 2018-05-07 DIAGNOSIS — J449 Chronic obstructive pulmonary disease, unspecified: Secondary | ICD-10-CM | POA: Insufficient documentation

## 2018-05-07 DIAGNOSIS — Z79899 Other long term (current) drug therapy: Secondary | ICD-10-CM | POA: Insufficient documentation

## 2018-05-07 DIAGNOSIS — E119 Type 2 diabetes mellitus without complications: Secondary | ICD-10-CM | POA: Diagnosis not present

## 2018-05-07 DIAGNOSIS — K219 Gastro-esophageal reflux disease without esophagitis: Secondary | ICD-10-CM | POA: Diagnosis not present

## 2018-05-07 DIAGNOSIS — C9590 Leukemia, unspecified not having achieved remission: Secondary | ICD-10-CM | POA: Insufficient documentation

## 2018-05-07 DIAGNOSIS — Z87891 Personal history of nicotine dependence: Secondary | ICD-10-CM | POA: Diagnosis not present

## 2018-05-07 NOTE — Progress Notes (Signed)
Pulmonary Individual Treatment Plan  Patient Details  Name: Nicolas Mayo MRN: [id number] Date of Birth: 1940-10-03 Referring Provider:     Pulmonary Rehab from 05/07/2018 in [facility] Cardiac and Pulmonary Rehab  Referring Provider  Rechholtz      Initial Encounter Date:    Pulmonary Rehab from 05/07/2018 in [facility] Cardiac and Pulmonary Rehab  Date  05/07/18      Visit Diagnosis: Chronic obstructive pulmonary disease, unspecified COPD type ([place])  Patient's Home Medications on Admission:  Current Outpatient Medications:  .  albuterol (PROVENTIL, VENTOLIN) (5 MG/ML) 0.5% NEBU, Inhale into the lungs., Disp: , Rfl:  .  amLODipine (NORVASC) 5 MG tablet, Take by mouth., Disp: , Rfl:  .  cholecalciferol (VITAMIN D) 1000 units tablet, Take 1,000 Units by mouth daily. Vit. D 3, Disp: , Rfl:  .  imatinib (GLEEVEC) 100 MG tablet, Take 300 mg by mouth daily. Reported on 01/10/2016, Disp: , Rfl:  .  Multiple Vitamins-Minerals (PRESERVISION AREDS 2 PO), Take 2 capsules by mouth daily., Disp: , Rfl:  .  pantoprazole (PROTONIX) 40 MG tablet, Take 40 mg by mouth daily. Reported on 01/11/2016, Disp: , Rfl:  .  UNABLE TO FIND, Take by mouth., Disp: , Rfl:  .  valsartan (DIOVAN) 40 MG tablet, Take 40 mg by mouth daily. , Disp: , Rfl:  .  vitamin B-12 (CYANOCOBALAMIN) 1000 MCG tablet, Take 1,000 mcg by mouth daily., Disp: , Rfl:   Past Medical History: Past Medical History:  Diagnosis Date  . Blood dyscrasia   . Cancer ([place])    leukemia  . Colon polyp   . Diabetes mellitus without complication ([place])   . Diverticulosis   . Diverticulosis   . Duodenitis   . Duodenitis   . Duodenitis   . Esophagitis   . Gastropathy   . Gastropathy   . GERD (gastroesophageal reflux disease)   . Schatzki's ring 08/18/2014  . Schatzki's ring   . Tubulovillous adenoma polyp of colon     Tobacco Use: Social History   Tobacco Use  Smoking Status Former Smoker  . Packs/day: 1.00  . Years: 60.00  . Pack  years: 60.00  . Types: Cigarettes  . Last attempt to quit: 12/05/2017  . Years since quitting: 0.4  Smokeless Tobacco Never Used  Tobacco Comment   patient states he is done smoking.    Labs: Recent Review Flowsheet Data    There is no flowsheet data to display.       Pulmonary Assessment Scores: Pulmonary Assessment Scores    Row Name 05/07/18 1119         ADL UCSD   ADL Phase  Entry     SOB Score total  63     Rest  2     Walk  2     Stairs  3     Bath  3     Dress  3     Shop  3       CAT Score   CAT Score  27       mMRC Score   mMRC Score  2        Pulmonary Function Assessment: Pulmonary Function Assessment - 05/07/18 1119      Initial Spirometry Results   FVC%  49 %    FEV1%  43 %    FEV1/FVC Ratio  64.04    Comments  good patient effort      Post Bronchodilator Spirometry Results   FVC%  53.9 %    FEV1%  49.45 %    FEV1/FVC Ratio  67.44    Comments  good patient effort      Breath   Bilateral Breath Sounds  Clear    Shortness of Breath  Yes;Limiting activity;Panic with Shortness of Breath       Exercise Target Goals: Exercise Program Goal: Individual exercise prescription set using results from initial 6 min walk test and THRR while considering  patient's activity barriers and safety.   Exercise Prescription Goal: Initial exercise prescription builds to 30-45 minutes a day of aerobic activity, 2-3 days per week.  Home exercise guidelines will be given to patient during program as part of exercise prescription that the participant will acknowledge.  Activity Barriers & Risk Stratification:   6 Minute Walk: 6 Minute Walk    Row Name 05/07/18 1313         6 Minute Walk   Phase  Initial     Distance  875 feet     Walk Time  6 minutes     # of Rest Breaks  0     MPH  1.65     METS  2.14     RPE  11     Perceived Dyspnea   1     VO2 Peak  7.5     Symptoms  No     Resting HR  83 bpm     Resting BP  150/56     Resting Oxygen  Saturation   97 %     Exercise Oxygen Saturation  during 6 min walk  92 %     Max Ex. HR  106 bpm     Max Ex. BP  170/56     2 Minute Post BP  148/62       Interval HR   1 Minute HR  95     2 Minute HR  104     3 Minute HR  97     4 Minute HR  102     5 Minute HR  106     6 Minute HR  106     2 Minute Post HR  97     Interval Heart Rate?  Yes       Interval Oxygen   Interval Oxygen?  Yes     Baseline Oxygen Saturation %  97 %     1 Minute Oxygen Saturation %  92 %     1 Minute Liters of Oxygen  0 L     2 Minute Oxygen Saturation %  95 %     2 Minute Liters of Oxygen  0 L     3 Minute Oxygen Saturation %  94 %     3 Minute Liters of Oxygen  0 L     4 Minute Oxygen Saturation %  96 %     4 Minute Liters of Oxygen  0 L     5 Minute Oxygen Saturation %  95 %     5 Minute Liters of Oxygen  0 L     6 Minute Oxygen Saturation %  96 %     6 Minute Liters of Oxygen  0 L     2 Minute Post Oxygen Saturation %  98 %     2 Minute Post Liters of Oxygen  0 L       Oxygen Initial Assessment: Oxygen Initial Assessment - 05/07/18 1118  Home Oxygen   Home Oxygen Device  None    Sleep Oxygen Prescription  None    Home Exercise Oxygen Prescription  None    Home at Rest Exercise Oxygen Prescription  None      Initial 6 min Walk   Oxygen Used  None      Program Oxygen Prescription   Program Oxygen Prescription  None      Intervention   Short Term Goals  To learn and demonstrate proper use of respiratory medications;To learn and demonstrate proper pursed lip breathing techniques or other breathing techniques.;To learn and understand importance of maintaining oxygen saturations>88%;To learn and understand importance of monitoring SPO2 with pulse oximeter and demonstrate accurate use of the pulse oximeter.    Long  Term Goals  Verbalizes importance of monitoring SPO2 with pulse oximeter and return demonstration;Maintenance of O2 saturations>88%;Exhibits proper breathing techniques,  such as pursed lip breathing or other method taught during program session;Demonstrates proper use of MDI's;Compliance with respiratory medication       Oxygen Re-Evaluation:   Oxygen Discharge (Final Oxygen Re-Evaluation):   Initial Exercise Prescription: Initial Exercise Prescription - 05/07/18 1300      Date of Initial Exercise RX and Referring Provider   Date  05/07/18    Referring Provider  Rechholtz      Treadmill   MPH  1.6    Grade  0    Minutes  15    METs  2.23      Recumbant Bike   Level  1    RPM  60    Minutes  15    METs  2      NuStep   Level  1    SPM  80    Minutes  15    METs  2      Prescription Details   Frequency (times per week)  3    Duration  Progress to 45 minutes of aerobic exercise without signs/symptoms of physical distress      Intensity   THRR 40-80% of Max Heartrate  107-132    Ratings of Perceived Exertion  11-15    Perceived Dyspnea  0-4      Resistance Training   Training Prescription  Yes    Weight  3 lb    Reps  10-15       Perform Capillary Blood Glucose checks as needed.  Exercise Prescription Changes:   Exercise Comments:   Exercise Goals and Review: Exercise Goals    Row Name 05/07/18 1313             Exercise Goals   Increase Physical Activity  Yes       Intervention  Provide advice, education, support and counseling about physical activity/exercise needs.;Develop an individualized exercise prescription for aerobic and resistive training based on initial evaluation findings, risk stratification, comorbidities and participant's personal goals.       Expected Outcomes  Short Term: Attend rehab on a regular basis to increase amount of physical activity.;Long Term: Add in home exercise to make exercise part of routine and to increase amount of physical activity.;Long Term: Exercising regularly at least 3-5 days a week.       Increase Strength and Stamina  Yes       Intervention  Provide advice, education,  support and counseling about physical activity/exercise needs.;Develop an individualized exercise prescription for aerobic and resistive training based on initial evaluation findings, risk stratification, comorbidities and participant's personal goals.  Expected Outcomes  Short Term: Increase workloads from initial exercise prescription for resistance, speed, and METs.;Short Term: Perform resistance training exercises routinely during rehab and add in resistance training at home;Long Term: Improve cardiorespiratory fitness, muscular endurance and strength as measured by increased METs and functional capacity (6MWT)       Able to understand and use rate of perceived exertion (RPE) scale  Yes       Intervention  Provide education and explanation on how to use RPE scale       Expected Outcomes  Short Term: Able to use RPE daily in rehab to express subjective intensity level;Long Term:  Able to use RPE to guide intensity level when exercising independently       Able to understand and use Dyspnea scale  Yes       Intervention  Provide education and explanation on how to use Dyspnea scale       Expected Outcomes  Short Term: Able to use Dyspnea scale daily in rehab to express subjective sense of shortness of breath during exertion;Long Term: Able to use Dyspnea scale to guide intensity level when exercising independently       Knowledge and understanding of Target Heart Rate Range (THRR)  Yes       Intervention  Provide education and explanation of THRR including how the numbers were predicted and where they are located for reference       Expected Outcomes  Short Term: Able to state/look up THRR;Short Term: Able to use daily as guideline for intensity in rehab;Long Term: Able to use THRR to govern intensity when exercising independently       Able to check pulse independently  Yes       Intervention  Provide education and demonstration on how to check pulse in carotid and radial arteries.;Review the  importance of being able to check your own pulse for safety during independent exercise       Expected Outcomes  Short Term: Able to explain why pulse checking is important during independent exercise;Long Term: Able to check pulse independently and accurately       Understanding of Exercise Prescription  Yes       Intervention  Provide education, explanation, and written materials on patient's individual exercise prescription       Expected Outcomes  Short Term: Able to explain program exercise prescription;Long Term: Able to explain home exercise prescription to exercise independently          Exercise Goals Re-Evaluation :   Discharge Exercise Prescription (Final Exercise Prescription Changes):   Nutrition:  Target Goals: Understanding of nutrition guidelines, daily intake of sodium <155m, cholesterol <2041m calories 30% from fat and 7% or less from saturated fats, daily to have 5 or more servings of fruits and vegetables.  Biometrics: Pre Biometrics - 05/07/18 1304      Pre Biometrics   Height  6' (1.829 m)    Weight  217 lb 4.8 oz (98.6 kg)    Waist Circumference  43.5 inches    Hip Circumference  42 inches    Waist to Hip Ratio  1.04 %    BMI (Calculated)  29.46    Single Leg Stand  0 seconds        Nutrition Therapy Plan and Nutrition Goals: Nutrition Therapy & Goals - 05/07/18 1122      Personal Nutrition Goals   Nutrition Goal  Gain a better appitite    Comments  He sometimes does not have the  best appetite. He likes candy from time to time. He does not want to gain weight.      Intervention Plan   Intervention  Prescribe, educate and counsel regarding individualized specific dietary modifications aiming towards targeted core components such as weight, hypertension, lipid management, diabetes, heart failure and other comorbidities.;Nutrition handout(s) given to patient.    Expected Outcomes  Short Term Goal: Understand basic principles of dietary content, such as  calories, fat, sodium, cholesterol and nutrients.;Long Term Goal: Adherence to prescribed nutrition plan.       Nutrition Assessments:   Nutrition Goals Re-Evaluation:   Nutrition Goals Discharge (Final Nutrition Goals Re-Evaluation):   Psychosocial: Target Goals: Acknowledge presence or absence of significant depression and/or stress, maximize coping skills, provide positive support system. Participant is able to verbalize types and ability to use techniques and skills needed for reducing stress and depression.   Initial Review & Psychosocial Screening: Initial Psych Review & Screening - 05/07/18 1121      Initial Review   Current issues with  Current Stress Concerns    Source of Stress Concerns  Chronic Illness    Comments  His breathing is his main concern for his stress.      Family Dynamics   Good Support System?  Yes    Comments  He can look to his wife Arbie Cookey for support.      Barriers   Psychosocial barriers to participate in program  The patient should benefit from training in stress management and relaxation.      Screening Interventions   Interventions  Encouraged to exercise;To provide support and resources with identified psychosocial needs;Provide feedback about the scores to participant;Program counselor consult    Expected Outcomes  Short Term goal: Utilizing psychosocial counselor, staff and physician to assist with identification of specific Stressors or current issues interfering with healing process. Setting desired goal for each stressor or current issue identified.;Long Term Goal: Stressors or current issues are controlled or eliminated.;Short Term goal: Identification and review with participant of any Quality of Life or Depression concerns found by scoring the questionnaire.;Long Term goal: The participant improves quality of Life and PHQ9 Scores as seen by post scores and/or verbalization of changes       Quality of Life Scores:  Scores of 19 and below  usually indicate a poorer quality of life in these areas.  A difference of  2-3 points is a clinically meaningful difference.  A difference of 2-3 points in the total score of the Quality of Life Index has been associated with significant improvement in overall quality of life, self-image, physical symptoms, and general health in studies assessing change in quality of life.  PHQ-9: Recent Review Flowsheet Data    Depression screen [facility] 2/9 05/07/2018   Decreased Interest 0   Down, Depressed, Hopeless 0   PHQ - 2 Score 0   Altered sleeping 0   Tired, decreased energy 1   Change in appetite 0   Feeling bad or failure about yourself  1   Trouble concentrating 0   Moving slowly or fidgety/restless 1   Suicidal thoughts 0   PHQ-9 Score 3   Difficult doing work/chores Somewhat difficult     Interpretation of Total Score  Total Score Depression Severity:  1-4 = Minimal depression, 5-9 = Mild depression, 10-14 = Moderate depression, 15-19 = Moderately severe depression, 20-27 = Severe depression   Psychosocial Evaluation and Intervention:   Psychosocial Re-Evaluation:   Psychosocial Discharge (Final Psychosocial Re-Evaluation):  Education: Education Goals: Education classes will be provided on a weekly basis, covering required topics. Participant will state understanding/return demonstration of topics presented.  Learning Barriers/Preferences: Learning Barriers/Preferences - 05/07/18 1123      Learning Barriers/Preferences   Learning Barriers  Sight   wears glasses   Learning Preferences  None       Education Topics:  Initial Evaluation Education: - Verbal, written and demonstration of respiratory meds, oximetry and breathing techniques. Instruction on use of nebulizers and MDIs and importance of monitoring MDI activations.   Pulmonary Rehab from 05/07/2018 in [facility] Cardiac and Pulmonary Rehab  Date  05/07/18  Educator  [facility]  Instruction Review Code  1- Verbalizes  Understanding      General Nutrition Guidelines/Fats and Fiber: -Group instruction provided by verbal, written material, models and posters to present the general guidelines for heart healthy nutrition. Gives an explanation and review of dietary fats and fiber.   Controlling Sodium/Reading Food Labels: -Group verbal and written material supporting the discussion of sodium use in heart healthy nutrition. Review and explanation with models, verbal and written materials for utilization of the food label.   Exercise Physiology & General Exercise Guidelines: - Group verbal and written instruction with models to review the exercise physiology of the cardiovascular system and associated critical values. Provides general exercise guidelines with specific guidelines to those with heart or lung disease.    Aerobic Exercise & Resistance Training: - Gives group verbal and written instruction on the various components of exercise. Focuses on aerobic and resistive training programs and the benefits of this training and how to safely progress through these programs.   Flexibility, Balance, Mind/Body Relaxation: Provides group verbal/written instruction on the benefits of flexibility and balance training, including mind/body exercise modes such as yoga, pilates and tai chi.  Demonstration and skill practice provided.   Stress and Anxiety: - Provides group verbal and written instruction about the health risks of elevated stress and causes of high stress.  Discuss the correlation between heart/lung disease and anxiety and treatment options. Review healthy ways to manage with stress and anxiety.   Depression: - Provides group verbal and written instruction on the correlation between heart/lung disease and depressed mood, treatment options, and the stigmas associated with seeking treatment.   Exercise & Equipment Safety: - Individual verbal instruction and demonstration of equipment use and safety with  use of the equipment.   Pulmonary Rehab from 05/07/2018 in [facility] Cardiac and Pulmonary Rehab  Date  05/07/18  Educator  [facility]  Instruction Review Code  1- Verbalizes Understanding      Infection Prevention: - Provides verbal and written material to individual with discussion of infection control including proper hand washing and proper equipment cleaning during exercise session.   Pulmonary Rehab from 05/07/2018 in [facility] Cardiac and Pulmonary Rehab  Date  05/07/18  Educator  [facility]  Instruction Review Code  1- Verbalizes Understanding      Falls Prevention: - Provides verbal and written material to individual with discussion of falls prevention and safety.   Pulmonary Rehab from 05/07/2018 in [facility] Cardiac and Pulmonary Rehab  Date  05/07/18  Educator  [facility]  Instruction Review Code  1- Verbalizes Understanding      Diabetes: - Individual verbal and written instruction to review signs/symptoms of diabetes, desired ranges of glucose level fasting, after meals and with exercise. Advice that pre and post exercise glucose checks will be done for 3 sessions at entry of program.   Chronic Lung Diseases: -  Group verbal and written instruction to review updates, respiratory medications, advancements in procedures and treatments. Discuss use of supplemental oxygen including available portable oxygen systems, continuous and intermittent flow rates, concentrators, personal use and safety guidelines. Review proper use of inhaler and spacers. Provide informative websites for self-education.    Energy Conservation: - Provide group verbal and written instruction for methods to conserve energy, plan and organize activities. Instruct on pacing techniques, use of adaptive equipment and posture/positioning to relieve shortness of breath.   Triggers and Exacerbations: - Group verbal and written instruction to review types of environmental triggers and ways to prevent exacerbations. Discuss weather changes, air  quality and the benefits of nasal washing. Review warning signs and symptoms to help prevent infections. Discuss techniques for effective airway clearance, coughing, and vibrations.   AED/CPR: - Group verbal and written instruction with the use of models to demonstrate the basic use of the AED with the basic ABC's of resuscitation.   Anatomy and Physiology of the Lungs: - Group verbal and written instruction with the use of models to provide basic lung anatomy and physiology related to function, structure and complications of lung disease.   Anatomy & Physiology of the Heart: - Group verbal and written instruction and models provide basic cardiac anatomy and physiology, with the coronary electrical and arterial systems. Review of Valvular disease and Heart Failure   Cardiac Medications: - Group verbal and written instruction to review commonly prescribed medications for heart disease. Reviews the medication, class of the drug, and side effects.   Know Your Numbers and Risk Factors: -Group verbal and written instruction about important numbers in your health.  Discussion of what are risk factors and how they play a role in the disease process.  Review of Cholesterol, Blood Pressure, Diabetes, and BMI and the role they play in your overall health.   Sleep Hygiene: -Provides group verbal and written instruction about how sleep can affect your health.  Define sleep hygiene, discuss sleep cycles and impact of sleep habits. Review good sleep hygiene tips.    Other: -Provides group and verbal instruction on various topics (see comments)    Knowledge Questionnaire Score: Knowledge Questionnaire Score - 05/07/18 1139      Knowledge Questionnaire Score   Pre Score  13/18   reviewed with patient       Core Components/Risk Factors/Patient Goals at Admission: Personal Goals and Risk Factors at Admission - 05/07/18 1124      Core Components/Risk Factors/Patient Goals on Admission     Weight Management  Yes;Weight Loss    Intervention  Weight Management: Develop a combined nutrition and exercise program designed to reach desired caloric intake, while maintaining appropriate intake of nutrient and fiber, sodium and fats, and appropriate energy expenditure required for the weight goal.;Weight Management: Provide education and appropriate resources to help participant work on and attain dietary goals.    Admit Weight  217 lb (98.4 kg)    Goal Weight: Short Term  212 lb (96.2 kg)    Goal Weight: Long Term  205 lb (93 kg)    Expected Outcomes  Short Term: Continue to assess and modify interventions until short term weight is achieved;Long Term: Adherence to nutrition and physical activity/exercise program aimed toward attainment of established weight goal;Weight Maintenance: Understanding of the daily nutrition guidelines, which includes 25-35% calories from fat, 7% or less cal from saturated fats, less than 280m cholesterol, less than 1.5gm of sodium, & 5 or more servings of fruits and vegetables  daily;Understanding recommendations for meals to include 15-35% energy as protein, 25-35% energy from fat, 35-60% energy from carbohydrates, less than 267m of dietary cholesterol, 20-35 gm of total fiber daily;Understanding of distribution of calorie intake throughout the day with the consumption of 4-5 meals/snacks    Improve shortness of breath with ADL's  Yes    Intervention  Provide education, individualized exercise plan and daily activity instruction to help decrease symptoms of SOB with activities of daily living.    Expected Outcomes  Short Term: Improve cardiorespiratory fitness to achieve a reduction of symptoms when performing ADLs;Long Term: Be able to perform more ADLs without symptoms or delay the onset of symptoms    Diabetes  Yes    Intervention  Provide education about signs/symptoms and action to take for hypo/hyperglycemia.;Provide education about proper nutrition, including  hydration, and aerobic/resistive exercise prescription along with prescribed medications to achieve blood glucose in normal ranges: Fasting glucose 65-99 mg/dL    Expected Outcomes  Short Term: Participant verbalizes understanding of the signs/symptoms and immediate care of hyper/hypoglycemia, proper foot care and importance of medication, aerobic/resistive exercise and nutrition plan for blood glucose control.;Long Term: Attainment of HbA1C < 7%.    Hypertension  Yes    Intervention  Provide education on lifestyle modifcations including regular physical activity/exercise, weight management, moderate sodium restriction and increased consumption of fresh fruit, vegetables, and low fat dairy, alcohol moderation, and smoking cessation.;Monitor prescription use compliance.    Expected Outcomes  Short Term: Continued assessment and intervention until BP is < 140/942mHG in hypertensive participants. < 130/80107mG in hypertensive participants with diabetes, heart failure or chronic kidney disease.;Long Term: Maintenance of blood pressure at goal levels.       Core Components/Risk Factors/Patient Goals Review:    Core Components/Risk Factors/Patient Goals at Discharge (Final Review):    ITP Comments: ITP Comments    Row Name 05/07/18 1043           ITP Comments  Medical Evaluation completed. Chart sent for review and changes to Dr. [place] be found in [facility] in [place].          Comments: Initial ITP

## 2018-05-07 NOTE — Patient Instructions (Signed)
Patient Instructions  Patient Details  Name: Nicolas Mayo MRN: [id number] Date of Birth: Mar 12, 1941 Referring Provider:  Adrian Blackwater, NP  Below are your personal goals for exercise, nutrition, and risk factors. Our goal is to help you stay on track towards obtaining and maintaining these goals. We will be discussing your progress on these goals with you throughout the program.  Initial Exercise Prescription: Initial Exercise Prescription - 05/07/18 1300      Date of Initial Exercise RX and Referring Provider   Date  05/07/18    Referring Provider  Rechholtz      Treadmill   MPH  1.6    Grade  0    Minutes  15    METs  2.23      Recumbant Bike   Level  1    RPM  60    Minutes  15    METs  2      NuStep   Level  1    SPM  80    Minutes  15    METs  2      Prescription Details   Frequency (times per week)  3    Duration  Progress to 45 minutes of aerobic exercise without signs/symptoms of physical distress      Intensity   THRR 40-80% of Max Heartrate  107-132    Ratings of Perceived Exertion  11-15    Perceived Dyspnea  0-4      Resistance Training   Training Prescription  Yes    Weight  3 lb    Reps  10-15       Exercise Goals: Frequency: Be able to perform aerobic exercise two to three times per week in program working toward 2-5 days per week of home exercise.  Intensity: Work with a perceived exertion of 11 (fairly light) - 15 (hard) while following your exercise prescription.  We will make changes to your prescription with you as you progress through the program.   Duration: Be able to do 30 to 45 minutes of continuous aerobic exercise in addition to a 5 minute warm-up and a 5 minute cool-down routine.   Nutrition Goals: Your personal nutrition goals will be established when you do your nutrition analysis with the dietician.  The following are general nutrition guidelines to follow: Cholesterol < 200mg /day Sodium < 1500mg /day Fiber: Men  over 50 yrs - 30 grams per day  Personal Goals: Personal Goals and Risk Factors at Admission - 05/07/18 1124      Core Components/Risk Factors/Patient Goals on Admission    Weight Management  Yes;Weight Loss    Intervention  Weight Management: Develop a combined nutrition and exercise program designed to reach desired caloric intake, while maintaining appropriate intake of nutrient and fiber, sodium and fats, and appropriate energy expenditure required for the weight goal.;Weight Management: Provide education and appropriate resources to help participant work on and attain dietary goals.    Admit Weight  217 lb (98.4 kg)    Goal Weight: Short Term  212 lb (96.2 kg)    Goal Weight: Long Term  205 lb (93 kg)    Expected Outcomes  Short Term: Continue to assess and modify interventions until short term weight is achieved;Long Term: Adherence to nutrition and physical activity/exercise program aimed toward attainment of established weight goal;Weight Maintenance: Understanding of the daily nutrition guidelines, which includes 25-35% calories from fat, 7% or less cal from saturated fats, less than 200mg  cholesterol, less than 1.5gm  of sodium, & 5 or more servings of fruits and vegetables daily;Understanding recommendations for meals to include 15-35% energy as protein, 25-35% energy from fat, 35-60% energy from carbohydrates, less than 200mg  of dietary cholesterol, 20-35 gm of total fiber daily;Understanding of distribution of calorie intake throughout the day with the consumption of 4-5 meals/snacks    Improve shortness of breath with ADL's  Yes    Intervention  Provide education, individualized exercise plan and daily activity instruction to help decrease symptoms of SOB with activities of daily living.    Expected Outcomes  Short Term: Improve cardiorespiratory fitness to achieve a reduction of symptoms when performing ADLs;Long Term: Be able to perform more ADLs without symptoms or delay the onset of  symptoms    Diabetes  Yes    Intervention  Provide education about signs/symptoms and action to take for hypo/hyperglycemia.;Provide education about proper nutrition, including hydration, and aerobic/resistive exercise prescription along with prescribed medications to achieve blood glucose in normal ranges: Fasting glucose 65-99 mg/dL    Expected Outcomes  Short Term: Participant verbalizes understanding of the signs/symptoms and immediate care of hyper/hypoglycemia, proper foot care and importance of medication, aerobic/resistive exercise and nutrition plan for blood glucose control.;Long Term: Attainment of HbA1C < 7%.    Hypertension  Yes    Intervention  Provide education on lifestyle modifcations including regular physical activity/exercise, weight management, moderate sodium restriction and increased consumption of fresh fruit, vegetables, and low fat dairy, alcohol moderation, and smoking cessation.;Monitor prescription use compliance.    Expected Outcomes  Short Term: Continued assessment and intervention until BP is < 140/26mm HG in hypertensive participants. < 130/79mm HG in hypertensive participants with diabetes, heart failure or chronic kidney disease.;Long Term: Maintenance of blood pressure at goal levels.       Tobacco Use Initial Evaluation: Social History   Tobacco Use  Smoking Status Former Smoker  . Packs/day: 1.00  . Years: 60.00  . Pack years: 60.00  . Types: Cigarettes  . Last attempt to quit: 12/05/2017  . Years since quitting: 0.4  Smokeless Tobacco Never Used  Tobacco Comment   patient states he is done smoking.    Exercise Goals and Review: Exercise Goals    Row Name 05/07/18 1313             Exercise Goals   Increase Physical Activity  Yes       Intervention  Provide advice, education, support and counseling about physical activity/exercise needs.;Develop an individualized exercise prescription for aerobic and resistive training based on initial  evaluation findings, risk stratification, comorbidities and participant's personal goals.       Expected Outcomes  Short Term: Attend rehab on a regular basis to increase amount of physical activity.;Long Term: Add in home exercise to make exercise part of routine and to increase amount of physical activity.;Long Term: Exercising regularly at least 3-5 days a week.       Increase Strength and Stamina  Yes       Intervention  Provide advice, education, support and counseling about physical activity/exercise needs.;Develop an individualized exercise prescription for aerobic and resistive training based on initial evaluation findings, risk stratification, comorbidities and participant's personal goals.       Expected Outcomes  Short Term: Increase workloads from initial exercise prescription for resistance, speed, and METs.;Short Term: Perform resistance training exercises routinely during rehab and add in resistance training at home;Long Term: Improve cardiorespiratory fitness, muscular endurance and strength as measured by increased METs and functional capacity (6MWT)  Able to understand and use rate of perceived exertion (RPE) scale  Yes       Intervention  Provide education and explanation on how to use RPE scale       Expected Outcomes  Short Term: Able to use RPE daily in rehab to express subjective intensity level;Long Term:  Able to use RPE to guide intensity level when exercising independently       Able to understand and use Dyspnea scale  Yes       Intervention  Provide education and explanation on how to use Dyspnea scale       Expected Outcomes  Short Term: Able to use Dyspnea scale daily in rehab to express subjective sense of shortness of breath during exertion;Long Term: Able to use Dyspnea scale to guide intensity level when exercising independently       Knowledge and understanding of Target Heart Rate Range (THRR)  Yes       Intervention  Provide education and explanation of THRR  including how the numbers were predicted and where they are located for reference       Expected Outcomes  Short Term: Able to state/look up THRR;Short Term: Able to use daily as guideline for intensity in rehab;Long Term: Able to use THRR to govern intensity when exercising independently       Able to check pulse independently  Yes       Intervention  Provide education and demonstration on how to check pulse in carotid and radial arteries.;Review the importance of being able to check your own pulse for safety during independent exercise       Expected Outcomes  Short Term: Able to explain why pulse checking is important during independent exercise;Long Term: Able to check pulse independently and accurately       Understanding of Exercise Prescription  Yes       Intervention  Provide education, explanation, and written materials on patient's individual exercise prescription       Expected Outcomes  Short Term: Able to explain program exercise prescription;Long Term: Able to explain home exercise prescription to exercise independently          Copy of goals given to participant.

## 2018-05-07 NOTE — Progress Notes (Signed)
Daily Session Note  Patient Details  Name: Nicolas Mayo MRN: [id number] Date of Birth: 1941-02-24 Referring Provider:     Pulmonary Rehab from 05/07/2018 in [facility]  Referring Provider  Rechholtz      Encounter Date: 05/07/2018  Check In: Session Check In - 05/07/18 1042      Check-In   Supervising physician immediately available to respond to emergencies  [facility] immediately available ER MD    Physician(s)  Dr. Jimmye Norman and [facility]     Location  [facility]-Cardiac & Pulmonary Rehab    Staff Present  Justin Mend RCP,RRT,BSRT;Amanda Oletta Darter, [place], ACSM CEP, Exercise Physiologist    Medication changes reported      No    Fall or balance concerns reported     No    Warm-up and Cool-down  Not performed (comment)   medical evaluation   Resistance Training Performed  No    VAD Patient?  No    PAD/SET Patient?  No      Pain Assessment   Currently in Pain?  No/denies          Social History   Tobacco Use  Smoking Status Former Smoker  . Packs/day: 1.00  . Years: 60.00  . Pack years: 60.00  . Types: Cigarettes  . Last attempt to quit: 12/05/2017  . Years since quitting: 0.4  Smokeless Tobacco Never Used  Tobacco Comment   patient states he is done smoking.    Goals Met:  Exercise tolerated well Personal goals reviewed No report of cardiac concerns or symptoms Strength training completed today  Goals Unmet:  Not Applicable  Comments: Pt able to follow exercise prescription today without complaint.  Will continue to monitor for progression. [place] Name 05/07/18 1313         6 Minute Walk   Phase  Initial     Distance  875 feet     Walk Time  6 minutes     # of Rest Breaks  0     MPH  1.65     METS  2.14     RPE  11     Perceived Dyspnea   1     VO2 Peak  7.5     Symptoms  No     Resting HR  83 bpm     Resting BP  150/56     Resting Oxygen Saturation   97 %     Exercise Oxygen Saturation  during 6 min walk  92  %     Max Ex. HR  106 bpm     Max Ex. BP  170/56     2 Minute Post BP  148/62       Interval HR   1 Minute HR  95     2 Minute HR  104     3 Minute HR  97     4 Minute HR  102     5 Minute HR  106     6 Minute HR  106     2 Minute Post HR  97     Interval Heart Rate?  Yes       Interval Oxygen   Interval Oxygen?  Yes     Baseline Oxygen Saturation %  97 %     1 Minute Oxygen Saturation %  92 %     1 Minute Liters of Oxygen  0 L  2 Minute Oxygen Saturation %  95 %     2 Minute Liters of Oxygen  0 L     3 Minute Oxygen Saturation %  94 %     3 Minute Liters of Oxygen  0 L     4 Minute Oxygen Saturation %  96 %     4 Minute Liters of Oxygen  0 L     5 Minute Oxygen Saturation %  95 %     5 Minute Liters of Oxygen  0 L     6 Minute Oxygen Saturation %  96 %     6 Minute Liters of Oxygen  0 L     2 Minute Post Oxygen Saturation %  98 %     2 Minute Post Liters of Oxygen  0 L       Service Time [id number]   Dr. Emily Filbert is Medical Director for [place] and [facility].

## 2018-05-13 DIAGNOSIS — J449 Chronic obstructive pulmonary disease, unspecified: Secondary | ICD-10-CM

## 2018-05-13 NOTE — Progress Notes (Signed)
Pulmonary Individual Treatment Plan  Patient Details  Name: Nicolas Mayo MRN: [id number] Date of Birth: 1940-10-03 Referring Provider:     Pulmonary Rehab from 05/07/2018 in [facility] Cardiac and Pulmonary Rehab  Referring Provider  Rechholtz      Initial Encounter Date:    Pulmonary Rehab from 05/07/2018 in [facility] Cardiac and Pulmonary Rehab  Date  05/07/18      Visit Diagnosis: Chronic obstructive pulmonary disease, unspecified COPD type ([place])  Patient's Home Medications on Admission:  Current Outpatient Medications:  .  albuterol (PROVENTIL, VENTOLIN) (5 MG/ML) 0.5% NEBU, Inhale into the lungs., Disp: , Rfl:  .  amLODipine (NORVASC) 5 MG tablet, Take by mouth., Disp: , Rfl:  .  cholecalciferol (VITAMIN D) 1000 units tablet, Take 1,000 Units by mouth daily. Vit. D 3, Disp: , Rfl:  .  imatinib (GLEEVEC) 100 MG tablet, Take 300 mg by mouth daily. Reported on 01/10/2016, Disp: , Rfl:  .  Multiple Vitamins-Minerals (PRESERVISION AREDS 2 PO), Take 2 capsules by mouth daily., Disp: , Rfl:  .  pantoprazole (PROTONIX) 40 MG tablet, Take 40 mg by mouth daily. Reported on 01/11/2016, Disp: , Rfl:  .  UNABLE TO FIND, Take by mouth., Disp: , Rfl:  .  valsartan (DIOVAN) 40 MG tablet, Take 40 mg by mouth daily. , Disp: , Rfl:  .  vitamin B-12 (CYANOCOBALAMIN) 1000 MCG tablet, Take 1,000 mcg by mouth daily., Disp: , Rfl:   Past Medical History: Past Medical History:  Diagnosis Date  . Blood dyscrasia   . Cancer ([place])    leukemia  . Colon polyp   . Diabetes mellitus without complication ([place])   . Diverticulosis   . Diverticulosis   . Duodenitis   . Duodenitis   . Duodenitis   . Esophagitis   . Gastropathy   . Gastropathy   . GERD (gastroesophageal reflux disease)   . Schatzki's ring 08/18/2014  . Schatzki's ring   . Tubulovillous adenoma polyp of colon     Tobacco Use: Social History   Tobacco Use  Smoking Status Former Smoker  . Packs/day: 1.00  . Years: 60.00  . Pack  years: 60.00  . Types: Cigarettes  . Last attempt to quit: 12/05/2017  . Years since quitting: 0.4  Smokeless Tobacco Never Used  Tobacco Comment   patient states he is done smoking.    Labs: Recent Review Flowsheet Data    There is no flowsheet data to display.       Pulmonary Assessment Scores: Pulmonary Assessment Scores    Row Name 05/07/18 1119         ADL UCSD   ADL Phase  Entry     SOB Score total  63     Rest  2     Walk  2     Stairs  3     Bath  3     Dress  3     Shop  3       CAT Score   CAT Score  27       mMRC Score   mMRC Score  2        Pulmonary Function Assessment: Pulmonary Function Assessment - 05/07/18 1119      Initial Spirometry Results   FVC%  49 %    FEV1%  43 %    FEV1/FVC Ratio  64.04    Comments  good patient effort      Post Bronchodilator Spirometry Results   FVC%  53.9 %    FEV1%  49.45 %    FEV1/FVC Ratio  67.44    Comments  good patient effort      Breath   Bilateral Breath Sounds  Clear    Shortness of Breath  Yes;Limiting activity;Panic with Shortness of Breath       Exercise Target Goals: Exercise Program Goal: Individual exercise prescription set using results from initial 6 min walk test and THRR while considering  patient's activity barriers and safety.   Exercise Prescription Goal: Initial exercise prescription builds to 30-45 minutes a day of aerobic activity, 2-3 days per week.  Home exercise guidelines will be given to patient during program as part of exercise prescription that the participant will acknowledge.  Activity Barriers & Risk Stratification:   6 Minute Walk: 6 Minute Walk    Row Name 05/07/18 1313         6 Minute Walk   Phase  Initial     Distance  875 feet     Walk Time  6 minutes     # of Rest Breaks  0     MPH  1.65     METS  2.14     RPE  11     Perceived Dyspnea   1     VO2 Peak  7.5     Symptoms  No     Resting HR  83 bpm     Resting BP  150/56     Resting Oxygen  Saturation   97 %     Exercise Oxygen Saturation  during 6 min walk  92 %     Max Ex. HR  106 bpm     Max Ex. BP  170/56     2 Minute Post BP  148/62       Interval HR   1 Minute HR  95     2 Minute HR  104     3 Minute HR  97     4 Minute HR  102     5 Minute HR  106     6 Minute HR  106     2 Minute Post HR  97     Interval Heart Rate?  Yes       Interval Oxygen   Interval Oxygen?  Yes     Baseline Oxygen Saturation %  97 %     1 Minute Oxygen Saturation %  92 %     1 Minute Liters of Oxygen  0 L     2 Minute Oxygen Saturation %  95 %     2 Minute Liters of Oxygen  0 L     3 Minute Oxygen Saturation %  94 %     3 Minute Liters of Oxygen  0 L     4 Minute Oxygen Saturation %  96 %     4 Minute Liters of Oxygen  0 L     5 Minute Oxygen Saturation %  95 %     5 Minute Liters of Oxygen  0 L     6 Minute Oxygen Saturation %  96 %     6 Minute Liters of Oxygen  0 L     2 Minute Post Oxygen Saturation %  98 %     2 Minute Post Liters of Oxygen  0 L       Oxygen Initial Assessment: Oxygen Initial Assessment - 05/07/18 1118  Home Oxygen   Home Oxygen Device  None    Sleep Oxygen Prescription  None    Home Exercise Oxygen Prescription  None    Home at Rest Exercise Oxygen Prescription  None      Initial 6 min Walk   Oxygen Used  None      Program Oxygen Prescription   Program Oxygen Prescription  None      Intervention   Short Term Goals  To learn and demonstrate proper use of respiratory medications;To learn and demonstrate proper pursed lip breathing techniques or other breathing techniques.;To learn and understand importance of maintaining oxygen saturations>88%;To learn and understand importance of monitoring SPO2 with pulse oximeter and demonstrate accurate use of the pulse oximeter.    Long  Term Goals  Verbalizes importance of monitoring SPO2 with pulse oximeter and return demonstration;Maintenance of O2 saturations>88%;Exhibits proper breathing techniques,  such as pursed lip breathing or other method taught during program session;Demonstrates proper use of MDI's;Compliance with respiratory medication       Oxygen Re-Evaluation:   Oxygen Discharge (Final Oxygen Re-Evaluation):   Initial Exercise Prescription: Initial Exercise Prescription - 05/07/18 1300      Date of Initial Exercise RX and Referring Provider   Date  05/07/18    Referring Provider  Rechholtz      Treadmill   MPH  1.6    Grade  0    Minutes  15    METs  2.23      Recumbant Bike   Level  1    RPM  60    Minutes  15    METs  2      NuStep   Level  1    SPM  80    Minutes  15    METs  2      Prescription Details   Frequency (times per week)  3    Duration  Progress to 45 minutes of aerobic exercise without signs/symptoms of physical distress      Intensity   THRR 40-80% of Max Heartrate  107-132    Ratings of Perceived Exertion  11-15    Perceived Dyspnea  0-4      Resistance Training   Training Prescription  Yes    Weight  3 lb    Reps  10-15       Perform Capillary Blood Glucose checks as needed.  Exercise Prescription Changes:   Exercise Comments:   Exercise Goals and Review: Exercise Goals    Row Name 05/07/18 1313             Exercise Goals   Increase Physical Activity  Yes       Intervention  Provide advice, education, support and counseling about physical activity/exercise needs.;Develop an individualized exercise prescription for aerobic and resistive training based on initial evaluation findings, risk stratification, comorbidities and participant's personal goals.       Expected Outcomes  Short Term: Attend rehab on a regular basis to increase amount of physical activity.;Long Term: Add in home exercise to make exercise part of routine and to increase amount of physical activity.;Long Term: Exercising regularly at least 3-5 days a week.       Increase Strength and Stamina  Yes       Intervention  Provide advice, education,  support and counseling about physical activity/exercise needs.;Develop an individualized exercise prescription for aerobic and resistive training based on initial evaluation findings, risk stratification, comorbidities and participant's personal goals.  Expected Outcomes  Short Term: Increase workloads from initial exercise prescription for resistance, speed, and METs.;Short Term: Perform resistance training exercises routinely during rehab and add in resistance training at home;Long Term: Improve cardiorespiratory fitness, muscular endurance and strength as measured by increased METs and functional capacity (6MWT)       Able to understand and use rate of perceived exertion (RPE) scale  Yes       Intervention  Provide education and explanation on how to use RPE scale       Expected Outcomes  Short Term: Able to use RPE daily in rehab to express subjective intensity level;Long Term:  Able to use RPE to guide intensity level when exercising independently       Able to understand and use Dyspnea scale  Yes       Intervention  Provide education and explanation on how to use Dyspnea scale       Expected Outcomes  Short Term: Able to use Dyspnea scale daily in rehab to express subjective sense of shortness of breath during exertion;Long Term: Able to use Dyspnea scale to guide intensity level when exercising independently       Knowledge and understanding of Target Heart Rate Range (THRR)  Yes       Intervention  Provide education and explanation of THRR including how the numbers were predicted and where they are located for reference       Expected Outcomes  Short Term: Able to state/look up THRR;Short Term: Able to use daily as guideline for intensity in rehab;Long Term: Able to use THRR to govern intensity when exercising independently       Able to check pulse independently  Yes       Intervention  Provide education and demonstration on how to check pulse in carotid and radial arteries.;Review the  importance of being able to check your own pulse for safety during independent exercise       Expected Outcomes  Short Term: Able to explain why pulse checking is important during independent exercise;Long Term: Able to check pulse independently and accurately       Understanding of Exercise Prescription  Yes       Intervention  Provide education, explanation, and written materials on patient's individual exercise prescription       Expected Outcomes  Short Term: Able to explain program exercise prescription;Long Term: Able to explain home exercise prescription to exercise independently          Exercise Goals Re-Evaluation :   Discharge Exercise Prescription (Final Exercise Prescription Changes):   Nutrition:  Target Goals: Understanding of nutrition guidelines, daily intake of sodium <155m, cholesterol <2041m calories 30% from fat and 7% or less from saturated fats, daily to have 5 or more servings of fruits and vegetables.  Biometrics: Pre Biometrics - 05/07/18 1304      Pre Biometrics   Height  6' (1.829 m)    Weight  217 lb 4.8 oz (98.6 kg)    Waist Circumference  43.5 inches    Hip Circumference  42 inches    Waist to Hip Ratio  1.04 %    BMI (Calculated)  29.46    Single Leg Stand  0 seconds        Nutrition Therapy Plan and Nutrition Goals: Nutrition Therapy & Goals - 05/07/18 1122      Personal Nutrition Goals   Nutrition Goal  Gain a better appitite    Comments  He sometimes does not have the  best appetite. He likes candy from time to time. He does not want to gain weight.      Intervention Plan   Intervention  Prescribe, educate and counsel regarding individualized specific dietary modifications aiming towards targeted core components such as weight, hypertension, lipid management, diabetes, heart failure and other comorbidities.;Nutrition handout(s) given to patient.    Expected Outcomes  Short Term Goal: Understand basic principles of dietary content, such as  calories, fat, sodium, cholesterol and nutrients.;Long Term Goal: Adherence to prescribed nutrition plan.       Nutrition Assessments:   Nutrition Goals Re-Evaluation:   Nutrition Goals Discharge (Final Nutrition Goals Re-Evaluation):   Psychosocial: Target Goals: Acknowledge presence or absence of significant depression and/or stress, maximize coping skills, provide positive support system. Participant is able to verbalize types and ability to use techniques and skills needed for reducing stress and depression.   Initial Review & Psychosocial Screening: Initial Psych Review & Screening - 05/07/18 1121      Initial Review   Current issues with  Current Stress Concerns    Source of Stress Concerns  Chronic Illness    Comments  His breathing is his main concern for his stress.      Family Dynamics   Good Support System?  Yes    Comments  He can look to his wife Arbie Cookey for support.      Barriers   Psychosocial barriers to participate in program  The patient should benefit from training in stress management and relaxation.      Screening Interventions   Interventions  Encouraged to exercise;To provide support and resources with identified psychosocial needs;Provide feedback about the scores to participant;Program counselor consult    Expected Outcomes  Short Term goal: Utilizing psychosocial counselor, staff and physician to assist with identification of specific Stressors or current issues interfering with healing process. Setting desired goal for each stressor or current issue identified.;Long Term Goal: Stressors or current issues are controlled or eliminated.;Short Term goal: Identification and review with participant of any Quality of Life or Depression concerns found by scoring the questionnaire.;Long Term goal: The participant improves quality of Life and PHQ9 Scores as seen by post scores and/or verbalization of changes       Quality of Life Scores:  Scores of 19 and below  usually indicate a poorer quality of life in these areas.  A difference of  2-3 points is a clinically meaningful difference.  A difference of 2-3 points in the total score of the Quality of Life Index has been associated with significant improvement in overall quality of life, self-image, physical symptoms, and general health in studies assessing change in quality of life.  PHQ-9: Recent Review Flowsheet Data    Depression screen [facility] 2/9 05/07/2018   Decreased Interest 0   Down, Depressed, Hopeless 0   PHQ - 2 Score 0   Altered sleeping 0   Tired, decreased energy 1   Change in appetite 0   Feeling bad or failure about yourself  1   Trouble concentrating 0   Moving slowly or fidgety/restless 1   Suicidal thoughts 0   PHQ-9 Score 3   Difficult doing work/chores Somewhat difficult     Interpretation of Total Score  Total Score Depression Severity:  1-4 = Minimal depression, 5-9 = Mild depression, 10-14 = Moderate depression, 15-19 = Moderately severe depression, 20-27 = Severe depression   Psychosocial Evaluation and Intervention:   Psychosocial Re-Evaluation:   Psychosocial Discharge (Final Psychosocial Re-Evaluation):  Education: Education Goals: Education classes will be provided on a weekly basis, covering required topics. Participant will state understanding/return demonstration of topics presented.  Learning Barriers/Preferences: Learning Barriers/Preferences - 05/07/18 1123      Learning Barriers/Preferences   Learning Barriers  Sight   wears glasses   Learning Preferences  None       Education Topics:  Initial Evaluation Education: - Verbal, written and demonstration of respiratory meds, oximetry and breathing techniques. Instruction on use of nebulizers and MDIs and importance of monitoring MDI activations.   Pulmonary Rehab from 05/07/2018 in [facility] Cardiac and Pulmonary Rehab  Date  05/07/18  Educator  [facility]  Instruction Review Code  1- Verbalizes  Understanding      General Nutrition Guidelines/Fats and Fiber: -Group instruction provided by verbal, written material, models and posters to present the general guidelines for heart healthy nutrition. Gives an explanation and review of dietary fats and fiber.   Controlling Sodium/Reading Food Labels: -Group verbal and written material supporting the discussion of sodium use in heart healthy nutrition. Review and explanation with models, verbal and written materials for utilization of the food label.   Exercise Physiology & General Exercise Guidelines: - Group verbal and written instruction with models to review the exercise physiology of the cardiovascular system and associated critical values. Provides general exercise guidelines with specific guidelines to those with heart or lung disease.    Aerobic Exercise & Resistance Training: - Gives group verbal and written instruction on the various components of exercise. Focuses on aerobic and resistive training programs and the benefits of this training and how to safely progress through these programs.   Flexibility, Balance, Mind/Body Relaxation: Provides group verbal/written instruction on the benefits of flexibility and balance training, including mind/body exercise modes such as yoga, pilates and tai chi.  Demonstration and skill practice provided.   Stress and Anxiety: - Provides group verbal and written instruction about the health risks of elevated stress and causes of high stress.  Discuss the correlation between heart/lung disease and anxiety and treatment options. Review healthy ways to manage with stress and anxiety.   Depression: - Provides group verbal and written instruction on the correlation between heart/lung disease and depressed mood, treatment options, and the stigmas associated with seeking treatment.   Exercise & Equipment Safety: - Individual verbal instruction and demonstration of equipment use and safety with  use of the equipment.   Pulmonary Rehab from 05/07/2018 in [facility] Cardiac and Pulmonary Rehab  Date  05/07/18  Educator  [facility]  Instruction Review Code  1- Verbalizes Understanding      Infection Prevention: - Provides verbal and written material to individual with discussion of infection control including proper hand washing and proper equipment cleaning during exercise session.   Pulmonary Rehab from 05/07/2018 in [facility] Cardiac and Pulmonary Rehab  Date  05/07/18  Educator  [facility]  Instruction Review Code  1- Verbalizes Understanding      Falls Prevention: - Provides verbal and written material to individual with discussion of falls prevention and safety.   Pulmonary Rehab from 05/07/2018 in [facility] Cardiac and Pulmonary Rehab  Date  05/07/18  Educator  [facility]  Instruction Review Code  1- Verbalizes Understanding      Diabetes: - Individual verbal and written instruction to review signs/symptoms of diabetes, desired ranges of glucose level fasting, after meals and with exercise. Advice that pre and post exercise glucose checks will be done for 3 sessions at entry of program.   Chronic Lung Diseases: -  Group verbal and written instruction to review updates, respiratory medications, advancements in procedures and treatments. Discuss use of supplemental oxygen including available portable oxygen systems, continuous and intermittent flow rates, concentrators, personal use and safety guidelines. Review proper use of inhaler and spacers. Provide informative websites for self-education.    Energy Conservation: - Provide group verbal and written instruction for methods to conserve energy, plan and organize activities. Instruct on pacing techniques, use of adaptive equipment and posture/positioning to relieve shortness of breath.   Triggers and Exacerbations: - Group verbal and written instruction to review types of environmental triggers and ways to prevent exacerbations. Discuss weather changes, air  quality and the benefits of nasal washing. Review warning signs and symptoms to help prevent infections. Discuss techniques for effective airway clearance, coughing, and vibrations.   AED/CPR: - Group verbal and written instruction with the use of models to demonstrate the basic use of the AED with the basic ABC's of resuscitation.   Anatomy and Physiology of the Lungs: - Group verbal and written instruction with the use of models to provide basic lung anatomy and physiology related to function, structure and complications of lung disease.   Anatomy & Physiology of the Heart: - Group verbal and written instruction and models provide basic cardiac anatomy and physiology, with the coronary electrical and arterial systems. Review of Valvular disease and Heart Failure   Cardiac Medications: - Group verbal and written instruction to review commonly prescribed medications for heart disease. Reviews the medication, class of the drug, and side effects.   Know Your Numbers and Risk Factors: -Group verbal and written instruction about important numbers in your health.  Discussion of what are risk factors and how they play a role in the disease process.  Review of Cholesterol, Blood Pressure, Diabetes, and BMI and the role they play in your overall health.   Sleep Hygiene: -Provides group verbal and written instruction about how sleep can affect your health.  Define sleep hygiene, discuss sleep cycles and impact of sleep habits. Review good sleep hygiene tips.    Other: -Provides group and verbal instruction on various topics (see comments)    Knowledge Questionnaire Score: Knowledge Questionnaire Score - 05/07/18 1139      Knowledge Questionnaire Score   Pre Score  13/18   reviewed with patient       Core Components/Risk Factors/Patient Goals at Admission: Personal Goals and Risk Factors at Admission - 05/07/18 1124      Core Components/Risk Factors/Patient Goals on Admission     Weight Management  Yes;Weight Loss    Intervention  Weight Management: Develop a combined nutrition and exercise program designed to reach desired caloric intake, while maintaining appropriate intake of nutrient and fiber, sodium and fats, and appropriate energy expenditure required for the weight goal.;Weight Management: Provide education and appropriate resources to help participant work on and attain dietary goals.    Admit Weight  217 lb (98.4 kg)    Goal Weight: Short Term  212 lb (96.2 kg)    Goal Weight: Long Term  205 lb (93 kg)    Expected Outcomes  Short Term: Continue to assess and modify interventions until short term weight is achieved;Long Term: Adherence to nutrition and physical activity/exercise program aimed toward attainment of established weight goal;Weight Maintenance: Understanding of the daily nutrition guidelines, which includes 25-35% calories from fat, 7% or less cal from saturated fats, less than 280m cholesterol, less than 1.5gm of sodium, & 5 or more servings of fruits and vegetables  daily;Understanding recommendations for meals to include 15-35% energy as protein, 25-35% energy from fat, 35-60% energy from carbohydrates, less than 279m of dietary cholesterol, 20-35 gm of total fiber daily;Understanding of distribution of calorie intake throughout the day with the consumption of 4-5 meals/snacks    Improve shortness of breath with ADL's  Yes    Intervention  Provide education, individualized exercise plan and daily activity instruction to help decrease symptoms of SOB with activities of daily living.    Expected Outcomes  Short Term: Improve cardiorespiratory fitness to achieve a reduction of symptoms when performing ADLs;Long Term: Be able to perform more ADLs without symptoms or delay the onset of symptoms    Diabetes  Yes    Intervention  Provide education about signs/symptoms and action to take for hypo/hyperglycemia.;Provide education about proper nutrition, including  hydration, and aerobic/resistive exercise prescription along with prescribed medications to achieve blood glucose in normal ranges: Fasting glucose 65-99 mg/dL    Expected Outcomes  Short Term: Participant verbalizes understanding of the signs/symptoms and immediate care of hyper/hypoglycemia, proper foot care and importance of medication, aerobic/resistive exercise and nutrition plan for blood glucose control.;Long Term: Attainment of HbA1C < 7%.    Hypertension  Yes    Intervention  Provide education on lifestyle modifcations including regular physical activity/exercise, weight management, moderate sodium restriction and increased consumption of fresh fruit, vegetables, and low fat dairy, alcohol moderation, and smoking cessation.;Monitor prescription use compliance.    Expected Outcomes  Short Term: Continued assessment and intervention until BP is < 140/914mHG in hypertensive participants. < 130/8032mG in hypertensive participants with diabetes, heart failure or chronic kidney disease.;Long Term: Maintenance of blood pressure at goal levels.       Core Components/Risk Factors/Patient Goals Review:    Core Components/Risk Factors/Patient Goals at Discharge (Final Review):    ITP Comments: ITP Comments    Row Name 05/07/18 1043 05/13/18 0835         ITP Comments  Medical Evaluation completed. Chart sent for review and changes to Dr. MarEmily Filbertrector of [facility] can be found in Epic in [facility].  30 day review completed. ITP sent to Dr. MarEmily Filbertrector of [facility] with ITP unless changes are made by physician.         Comments: 30 day review

## 2018-05-16 ENCOUNTER — Telehealth: Payer: Self-pay

## 2018-05-16 DIAGNOSIS — J449 Chronic obstructive pulmonary disease, unspecified: Secondary | ICD-10-CM

## 2018-05-16 NOTE — Telephone Encounter (Signed)
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

## 2018-05-16 NOTE — Progress Notes (Signed)
Discharge Progress Report  Patient Details  Name: Nicolas Mayo MRN: [id number] Date of Birth: 18-Oct-1940 Referring Provider:     Pulmonary Rehab from 05/07/2018 in [facility]  Referring Provider  [place]       Number of Visits: 1/36  Reason for Discharge:  Early Exit:  Personal and health reasons  Smoking History:  Social History   Tobacco Use  Smoking Status Former Smoker  . Packs/day: 1.00  . Years: 60.00  . Pack years: 60.00  . Types: Cigarettes  . Last attempt to quit: 12/05/2017  . Years since quitting: 0.4  Smokeless Tobacco Never Used  Tobacco Comment   patient states he is done smoking.    Diagnosis:  Chronic obstructive pulmonary disease, unspecified COPD type ([place])  ADL [facility]: Pulmonary Assessment Scores    Row Name 05/07/18 1119         ADL [facility]   ADL Phase  Entry     SOB Score total  63     Rest  2     Walk  2     Stairs  3     Bath  3     Dress  3     Shop  3       CAT Score   CAT Score  27       mMRC Score   mMRC Score  2        Initial Exercise Prescription: Initial Exercise Prescription - 05/07/18 1300      Date of Initial Exercise RX and Referring Provider   Date  05/07/18    Referring Provider  Rechholtz      Treadmill   MPH  1.6    Grade  0    Minutes  15    METs  2.23      Recumbant Bike   Level  1    RPM  60    Minutes  15    METs  2      NuStep   Level  1    SPM  80    Minutes  15    METs  2      Prescription Details   Frequency (times per week)  3    Duration  Progress to 45 minutes of aerobic exercise without signs/symptoms of physical distress      Intensity   THRR 40-80% of Max Heartrate  107-132    Ratings of Perceived Exertion  11-15    Perceived Dyspnea  0-4      Resistance Training   Training Prescription  Yes    Weight  3 lb    Reps  10-15       Discharge Exercise Prescription (Final Exercise Prescription Changes):   Functional Capacity: 6 Minute Walk     Row Name 05/07/18 1313         6 Minute Walk   Phase  Initial     Distance  875 feet     Walk Time  6 minutes     # of Rest Breaks  0     MPH  1.65     METS  2.14     RPE  11     Perceived Dyspnea   1     VO2 Peak  7.5     Symptoms  No     Resting HR  83 bpm     Resting BP  150/56     Resting Oxygen Saturation  97 %     Exercise Oxygen Saturation  during 6 min walk  92 %     Max Ex. HR  106 bpm     Max Ex. BP  170/56     2 Minute Post BP  148/62       Interval HR   1 Minute HR  95     2 Minute HR  104     3 Minute HR  97     4 Minute HR  102     5 Minute HR  106     6 Minute HR  106     2 Minute Post HR  97     Interval Heart Rate?  Yes       Interval Oxygen   Interval Oxygen?  Yes     Baseline Oxygen Saturation %  97 %     1 Minute Oxygen Saturation %  92 %     1 Minute Liters of Oxygen  0 L     2 Minute Oxygen Saturation %  95 %     2 Minute Liters of Oxygen  0 L     3 Minute Oxygen Saturation %  94 %     3 Minute Liters of Oxygen  0 L     4 Minute Oxygen Saturation %  96 %     4 Minute Liters of Oxygen  0 L     5 Minute Oxygen Saturation %  95 %     5 Minute Liters of Oxygen  0 L     6 Minute Oxygen Saturation %  96 %     6 Minute Liters of Oxygen  0 L     2 Minute Post Oxygen Saturation %  98 %     2 Minute Post Liters of Oxygen  0 L        Psychological, QOL, Others - Outcomes: PHQ 2/9: Depression screen PHQ 2/9 05/07/2018  Decreased Interest 0  Down, Depressed, Hopeless 0  PHQ - 2 Score 0  Altered sleeping 0  Tired, decreased energy 1  Change in appetite 0  Feeling bad or failure about yourself  1  Trouble concentrating 0  Moving slowly or fidgety/restless 1  Suicidal thoughts 0  PHQ-9 Score 3  Difficult doing work/chores Somewhat difficult    Quality of Life:   Personal Goals: Goals established at orientation with interventions provided to work toward goal. Personal Goals and Risk Factors at Admission - 05/07/18 1124      Core  Components/Risk Factors/Patient Goals on Admission    Weight Management  Yes;Weight Loss    Intervention  Weight Management: Develop a combined nutrition and exercise program designed to reach desired caloric intake, while maintaining appropriate intake of nutrient and fiber, sodium and fats, and appropriate energy expenditure required for the weight goal.;Weight Management: Provide education and appropriate resources to help participant work on and attain dietary goals.    Admit Weight  217 lb (98.4 kg)    Goal Weight: Short Term  212 lb (96.2 kg)    Goal Weight: Long Term  205 lb (93 kg)    Expected Outcomes  Short Term: Continue to assess and modify interventions until short term weight is achieved;Long Term: Adherence to nutrition and physical activity/exercise program aimed toward attainment of established weight goal;Weight Maintenance: Understanding of the daily nutrition guidelines, which includes 25-35% calories from fat, 7% or less cal from saturated fats, less than 200mg  cholesterol,  less than 1.5gm of sodium, & 5 or more servings of fruits and vegetables daily;Understanding recommendations for meals to include 15-35% energy as protein, 25-35% energy from fat, 35-60% energy from carbohydrates, less than 200mg  of dietary cholesterol, 20-35 gm of total fiber daily;Understanding of distribution of calorie intake throughout the day with the consumption of 4-5 meals/snacks    Improve shortness of breath with ADL's  Yes    Intervention  Provide education, individualized exercise plan and daily activity instruction to help decrease symptoms of SOB with activities of daily living.    Expected Outcomes  Short Term: Improve cardiorespiratory fitness to achieve a reduction of symptoms when performing ADLs;Long Term: Be able to perform more ADLs without symptoms or delay the onset of symptoms    Diabetes  Yes    Intervention  Provide education about signs/symptoms and action to take for  hypo/hyperglycemia.;Provide education about proper nutrition, including hydration, and aerobic/resistive exercise prescription along with prescribed medications to achieve blood glucose in normal ranges: Fasting glucose 65-99 mg/dL    Expected Outcomes  Short Term: Participant verbalizes understanding of the signs/symptoms and immediate care of hyper/hypoglycemia, proper foot care and importance of medication, aerobic/resistive exercise and nutrition plan for blood glucose control.;Long Term: Attainment of HbA1C < 7%.    Hypertension  Yes    Intervention  Provide education on lifestyle modifcations including regular physical activity/exercise, weight management, moderate sodium restriction and increased consumption of fresh fruit, vegetables, and low fat dairy, alcohol moderation, and smoking cessation.;Monitor prescription use compliance.    Expected Outcomes  Short Term: Continued assessment and intervention until BP is < 140/88mm HG in hypertensive participants. < 130/81mm HG in hypertensive participants with diabetes, heart failure or chronic kidney disease.;Long Term: Maintenance of blood pressure at goal levels.        Personal Goals Discharge:   Exercise Goals and Review: Exercise Goals    Row Name 05/07/18 1313             Exercise Goals   Increase Physical Activity  Yes       Intervention  Provide advice, education, support and counseling about physical activity/exercise needs.;Develop an individualized exercise prescription for aerobic and resistive training based on initial evaluation findings, risk stratification, comorbidities and participant's personal goals.       Expected Outcomes  Short Term: Attend rehab on a regular basis to increase amount of physical activity.;Long Term: Add in home exercise to make exercise part of routine and to increase amount of physical activity.;Long Term: Exercising regularly at least 3-5 days a week.       Increase Strength and Stamina  Yes        Intervention  Provide advice, education, support and counseling about physical activity/exercise needs.;Develop an individualized exercise prescription for aerobic and resistive training based on initial evaluation findings, risk stratification, comorbidities and participant's personal goals.       Expected Outcomes  Short Term: Increase workloads from initial exercise prescription for resistance, speed, and METs.;Short Term: Perform resistance training exercises routinely during rehab and add in resistance training at home;Long Term: Improve cardiorespiratory fitness, muscular endurance and strength as measured by increased METs and functional capacity (6MWT)       Able to understand and use rate of perceived exertion (RPE) scale  Yes       Intervention  Provide education and explanation on how to use RPE scale       Expected Outcomes  Short Term: Able to use RPE daily in rehab  to express subjective intensity level;Long Term:  Able to use RPE to guide intensity level when exercising independently       Able to understand and use Dyspnea scale  Yes       Intervention  Provide education and explanation on how to use Dyspnea scale       Expected Outcomes  Short Term: Able to use Dyspnea scale daily in rehab to express subjective sense of shortness of breath during exertion;Long Term: Able to use Dyspnea scale to guide intensity level when exercising independently       Knowledge and understanding of Target Heart Rate Range (THRR)  Yes       Intervention  Provide education and explanation of THRR including how the numbers were predicted and where they are located for reference       Expected Outcomes  Short Term: Able to state/look up THRR;Short Term: Able to use daily as guideline for intensity in rehab;Long Term: Able to use THRR to govern intensity when exercising independently       Able to check pulse independently  Yes       Intervention  Provide education and demonstration on how to check pulse in  carotid and radial arteries.;Review the importance of being able to check your own pulse for safety during independent exercise       Expected Outcomes  Short Term: Able to explain why pulse checking is important during independent exercise;Long Term: Able to check pulse independently and accurately       Understanding of Exercise Prescription  Yes       Intervention  Provide education, explanation, and written materials on patient's individual exercise prescription       Expected Outcomes  Short Term: Able to explain program exercise prescription;Long Term: Able to explain home exercise prescription to exercise independently          Exercise Goals Re-Evaluation:   Nutrition & Weight - Outcomes: Pre Biometrics - 05/07/18 1304      Pre Biometrics   Height  6' (1.829 m)    Weight  217 lb 4.8 oz (98.6 kg)    Waist Circumference  43.5 inches    Hip Circumference  42 inches    Waist to Hip Ratio  1.04 %    BMI (Calculated)  29.46    Single Leg Stand  0 seconds        Nutrition: Nutrition Therapy & Goals - 05/07/18 1122      Personal Nutrition Goals   Nutrition Goal  Gain a better appitite    Comments  He sometimes does not have the best appetite. He likes candy from time to time. He does not want to gain weight.      Intervention Plan   Intervention  Prescribe, educate and counsel regarding individualized specific dietary modifications aiming towards targeted core components such as weight, hypertension, lipid management, diabetes, heart failure and other comorbidities.;Nutrition handout(s) given to patient.    Expected Outcomes  Short Term Goal: Understand basic principles of dietary content, such as calories, fat, sodium, cholesterol and nutrients.;Long Term Goal: Adherence to prescribed nutrition plan.       Nutrition Discharge:   Education Questionnaire Score: Knowledge Questionnaire Score - 05/07/18 1139      Knowledge Questionnaire Score   Pre Score  13/18   reviewed  with patient      Goals reviewed with patient; copy given to patient.

## 2018-05-16 NOTE — Progress Notes (Signed)
Pulmonary Individual Treatment Plan  Patient Details  Name: Nicolas Mayo MRN: [id number] Date of Birth: 1940-10-03 Referring Provider:     Pulmonary Rehab from 05/07/2018 in [facility] Cardiac and Pulmonary Rehab  Referring Provider  Rechholtz      Initial Encounter Date:    Pulmonary Rehab from 05/07/2018 in [facility] Cardiac and Pulmonary Rehab  Date  05/07/18      Visit Diagnosis: Chronic obstructive pulmonary disease, unspecified COPD type ([place])  Patient's Home Medications on Admission:  Current Outpatient Medications:  .  albuterol (PROVENTIL, VENTOLIN) (5 MG/ML) 0.5% NEBU, Inhale into the lungs., Disp: , Rfl:  .  amLODipine (NORVASC) 5 MG tablet, Take by mouth., Disp: , Rfl:  .  cholecalciferol (VITAMIN D) 1000 units tablet, Take 1,000 Units by mouth daily. Vit. D 3, Disp: , Rfl:  .  imatinib (GLEEVEC) 100 MG tablet, Take 300 mg by mouth daily. Reported on 01/10/2016, Disp: , Rfl:  .  Multiple Vitamins-Minerals (PRESERVISION AREDS 2 PO), Take 2 capsules by mouth daily., Disp: , Rfl:  .  pantoprazole (PROTONIX) 40 MG tablet, Take 40 mg by mouth daily. Reported on 01/11/2016, Disp: , Rfl:  .  UNABLE TO FIND, Take by mouth., Disp: , Rfl:  .  valsartan (DIOVAN) 40 MG tablet, Take 40 mg by mouth daily. , Disp: , Rfl:  .  vitamin B-12 (CYANOCOBALAMIN) 1000 MCG tablet, Take 1,000 mcg by mouth daily., Disp: , Rfl:   Past Medical History: Past Medical History:  Diagnosis Date  . Blood dyscrasia   . Cancer ([place])    leukemia  . Colon polyp   . Diabetes mellitus without complication ([place])   . Diverticulosis   . Diverticulosis   . Duodenitis   . Duodenitis   . Duodenitis   . Esophagitis   . Gastropathy   . Gastropathy   . GERD (gastroesophageal reflux disease)   . Schatzki's ring 08/18/2014  . Schatzki's ring   . Tubulovillous adenoma polyp of colon     Tobacco Use: Social History   Tobacco Use  Smoking Status Former Smoker  . Packs/day: 1.00  . Years: 60.00  . Pack  years: 60.00  . Types: Cigarettes  . Last attempt to quit: 12/05/2017  . Years since quitting: 0.4  Smokeless Tobacco Never Used  Tobacco Comment   patient states he is done smoking.    Labs: Recent Review Flowsheet Data    There is no flowsheet data to display.       Pulmonary Assessment Scores: Pulmonary Assessment Scores    Row Name 05/07/18 1119         ADL UCSD   ADL Phase  Entry     SOB Score total  63     Rest  2     Walk  2     Stairs  3     Bath  3     Dress  3     Shop  3       CAT Score   CAT Score  27       mMRC Score   mMRC Score  2        Pulmonary Function Assessment: Pulmonary Function Assessment - 05/07/18 1119      Initial Spirometry Results   FVC%  49 %    FEV1%  43 %    FEV1/FVC Ratio  64.04    Comments  good patient effort      Post Bronchodilator Spirometry Results   FVC%  53.9 %    FEV1%  49.45 %    FEV1/FVC Ratio  67.44    Comments  good patient effort      Breath   Bilateral Breath Sounds  Clear    Shortness of Breath  Yes;Limiting activity;Panic with Shortness of Breath       Exercise Target Goals: Exercise Program Goal: Individual exercise prescription set using results from initial 6 min walk test and THRR while considering  patient's activity barriers and safety.   Exercise Prescription Goal: Initial exercise prescription builds to 30-45 minutes a day of aerobic activity, 2-3 days per week.  Home exercise guidelines will be given to patient during program as part of exercise prescription that the participant will acknowledge.  Activity Barriers & Risk Stratification:   6 Minute Walk: 6 Minute Walk    Row Name 05/07/18 1313         6 Minute Walk   Phase  Initial     Distance  875 feet     Walk Time  6 minutes     # of Rest Breaks  0     MPH  1.65     METS  2.14     RPE  11     Perceived Dyspnea   1     VO2 Peak  7.5     Symptoms  No     Resting HR  83 bpm     Resting BP  150/56     Resting Oxygen  Saturation   97 %     Exercise Oxygen Saturation  during 6 min walk  92 %     Max Ex. HR  106 bpm     Max Ex. BP  170/56     2 Minute Post BP  148/62       Interval HR   1 Minute HR  95     2 Minute HR  104     3 Minute HR  97     4 Minute HR  102     5 Minute HR  106     6 Minute HR  106     2 Minute Post HR  97     Interval Heart Rate?  Yes       Interval Oxygen   Interval Oxygen?  Yes     Baseline Oxygen Saturation %  97 %     1 Minute Oxygen Saturation %  92 %     1 Minute Liters of Oxygen  0 L     2 Minute Oxygen Saturation %  95 %     2 Minute Liters of Oxygen  0 L     3 Minute Oxygen Saturation %  94 %     3 Minute Liters of Oxygen  0 L     4 Minute Oxygen Saturation %  96 %     4 Minute Liters of Oxygen  0 L     5 Minute Oxygen Saturation %  95 %     5 Minute Liters of Oxygen  0 L     6 Minute Oxygen Saturation %  96 %     6 Minute Liters of Oxygen  0 L     2 Minute Post Oxygen Saturation %  98 %     2 Minute Post Liters of Oxygen  0 L       Oxygen Initial Assessment: Oxygen Initial Assessment - 05/07/18 1118  Home Oxygen   Home Oxygen Device  None    Sleep Oxygen Prescription  None    Home Exercise Oxygen Prescription  None    Home at Rest Exercise Oxygen Prescription  None      Initial 6 min Walk   Oxygen Used  None      Program Oxygen Prescription   Program Oxygen Prescription  None      Intervention   Short Term Goals  To learn and demonstrate proper use of respiratory medications;To learn and demonstrate proper pursed lip breathing techniques or other breathing techniques.;To learn and understand importance of maintaining oxygen saturations>88%;To learn and understand importance of monitoring SPO2 with pulse oximeter and demonstrate accurate use of the pulse oximeter.    Long  Term Goals  Verbalizes importance of monitoring SPO2 with pulse oximeter and return demonstration;Maintenance of O2 saturations>88%;Exhibits proper breathing techniques,  such as pursed lip breathing or other method taught during program session;Demonstrates proper use of MDI's;Compliance with respiratory medication       Oxygen Re-Evaluation:   Oxygen Discharge (Final Oxygen Re-Evaluation):   Initial Exercise Prescription: Initial Exercise Prescription - 05/07/18 1300      Date of Initial Exercise RX and Referring Provider   Date  05/07/18    Referring Provider  Rechholtz      Treadmill   MPH  1.6    Grade  0    Minutes  15    METs  2.23      Recumbant Bike   Level  1    RPM  60    Minutes  15    METs  2      NuStep   Level  1    SPM  80    Minutes  15    METs  2      Prescription Details   Frequency (times per week)  3    Duration  Progress to 45 minutes of aerobic exercise without signs/symptoms of physical distress      Intensity   THRR 40-80% of Max Heartrate  107-132    Ratings of Perceived Exertion  11-15    Perceived Dyspnea  0-4      Resistance Training   Training Prescription  Yes    Weight  3 lb    Reps  10-15       Perform Capillary Blood Glucose checks as needed.  Exercise Prescription Changes:   Exercise Comments:   Exercise Goals and Review: Exercise Goals    Row Name 05/07/18 1313             Exercise Goals   Increase Physical Activity  Yes       Intervention  Provide advice, education, support and counseling about physical activity/exercise needs.;Develop an individualized exercise prescription for aerobic and resistive training based on initial evaluation findings, risk stratification, comorbidities and participant's personal goals.       Expected Outcomes  Short Term: Attend rehab on a regular basis to increase amount of physical activity.;Long Term: Add in home exercise to make exercise part of routine and to increase amount of physical activity.;Long Term: Exercising regularly at least 3-5 days a week.       Increase Strength and Stamina  Yes       Intervention  Provide advice, education,  support and counseling about physical activity/exercise needs.;Develop an individualized exercise prescription for aerobic and resistive training based on initial evaluation findings, risk stratification, comorbidities and participant's personal goals.  Expected Outcomes  Short Term: Increase workloads from initial exercise prescription for resistance, speed, and METs.;Short Term: Perform resistance training exercises routinely during rehab and add in resistance training at home;Long Term: Improve cardiorespiratory fitness, muscular endurance and strength as measured by increased METs and functional capacity (6MWT)       Able to understand and use rate of perceived exertion (RPE) scale  Yes       Intervention  Provide education and explanation on how to use RPE scale       Expected Outcomes  Short Term: Able to use RPE daily in rehab to express subjective intensity level;Long Term:  Able to use RPE to guide intensity level when exercising independently       Able to understand and use Dyspnea scale  Yes       Intervention  Provide education and explanation on how to use Dyspnea scale       Expected Outcomes  Short Term: Able to use Dyspnea scale daily in rehab to express subjective sense of shortness of breath during exertion;Long Term: Able to use Dyspnea scale to guide intensity level when exercising independently       Knowledge and understanding of Target Heart Rate Range (THRR)  Yes       Intervention  Provide education and explanation of THRR including how the numbers were predicted and where they are located for reference       Expected Outcomes  Short Term: Able to state/look up THRR;Short Term: Able to use daily as guideline for intensity in rehab;Long Term: Able to use THRR to govern intensity when exercising independently       Able to check pulse independently  Yes       Intervention  Provide education and demonstration on how to check pulse in carotid and radial arteries.;Review the  importance of being able to check your own pulse for safety during independent exercise       Expected Outcomes  Short Term: Able to explain why pulse checking is important during independent exercise;Long Term: Able to check pulse independently and accurately       Understanding of Exercise Prescription  Yes       Intervention  Provide education, explanation, and written materials on patient's individual exercise prescription       Expected Outcomes  Short Term: Able to explain program exercise prescription;Long Term: Able to explain home exercise prescription to exercise independently          Exercise Goals Re-Evaluation :   Discharge Exercise Prescription (Final Exercise Prescription Changes):   Nutrition:  Target Goals: Understanding of nutrition guidelines, daily intake of sodium <155m, cholesterol <2041m calories 30% from fat and 7% or less from saturated fats, daily to have 5 or more servings of fruits and vegetables.  Biometrics: Pre Biometrics - 05/07/18 1304      Pre Biometrics   Height  6' (1.829 m)    Weight  217 lb 4.8 oz (98.6 kg)    Waist Circumference  43.5 inches    Hip Circumference  42 inches    Waist to Hip Ratio  1.04 %    BMI (Calculated)  29.46    Single Leg Stand  0 seconds        Nutrition Therapy Plan and Nutrition Goals: Nutrition Therapy & Goals - 05/07/18 1122      Personal Nutrition Goals   Nutrition Goal  Gain a better appitite    Comments  He sometimes does not have the  best appetite. He likes candy from time to time. He does not want to gain weight.      Intervention Plan   Intervention  Prescribe, educate and counsel regarding individualized specific dietary modifications aiming towards targeted core components such as weight, hypertension, lipid management, diabetes, heart failure and other comorbidities.;Nutrition handout(s) given to patient.    Expected Outcomes  Short Term Goal: Understand basic principles of dietary content, such as  calories, fat, sodium, cholesterol and nutrients.;Long Term Goal: Adherence to prescribed nutrition plan.       Nutrition Assessments:   Nutrition Goals Re-Evaluation:   Nutrition Goals Discharge (Final Nutrition Goals Re-Evaluation):   Psychosocial: Target Goals: Acknowledge presence or absence of significant depression and/or stress, maximize coping skills, provide positive support system. Participant is able to verbalize types and ability to use techniques and skills needed for reducing stress and depression.   Initial Review & Psychosocial Screening: Initial Psych Review & Screening - 05/07/18 1121      Initial Review   Current issues with  Current Stress Concerns    Source of Stress Concerns  Chronic Illness    Comments  His breathing is his main concern for his stress.      Family Dynamics   Good Support System?  Yes    Comments  He can look to his wife Arbie Cookey for support.      Barriers   Psychosocial barriers to participate in program  The patient should benefit from training in stress management and relaxation.      Screening Interventions   Interventions  Encouraged to exercise;To provide support and resources with identified psychosocial needs;Provide feedback about the scores to participant;Program counselor consult    Expected Outcomes  Short Term goal: Utilizing psychosocial counselor, staff and physician to assist with identification of specific Stressors or current issues interfering with healing process. Setting desired goal for each stressor or current issue identified.;Long Term Goal: Stressors or current issues are controlled or eliminated.;Short Term goal: Identification and review with participant of any Quality of Life or Depression concerns found by scoring the questionnaire.;Long Term goal: The participant improves quality of Life and PHQ9 Scores as seen by post scores and/or verbalization of changes       Quality of Life Scores:  Scores of 19 and below  usually indicate a poorer quality of life in these areas.  A difference of  2-3 points is a clinically meaningful difference.  A difference of 2-3 points in the total score of the Quality of Life Index has been associated with significant improvement in overall quality of life, self-image, physical symptoms, and general health in studies assessing change in quality of life.  PHQ-9: Recent Review Flowsheet Data    Depression screen [facility] 2/9 05/07/2018   Decreased Interest 0   Down, Depressed, Hopeless 0   PHQ - 2 Score 0   Altered sleeping 0   Tired, decreased energy 1   Change in appetite 0   Feeling bad or failure about yourself  1   Trouble concentrating 0   Moving slowly or fidgety/restless 1   Suicidal thoughts 0   PHQ-9 Score 3   Difficult doing work/chores Somewhat difficult     Interpretation of Total Score  Total Score Depression Severity:  1-4 = Minimal depression, 5-9 = Mild depression, 10-14 = Moderate depression, 15-19 = Moderately severe depression, 20-27 = Severe depression   Psychosocial Evaluation and Intervention:   Psychosocial Re-Evaluation:   Psychosocial Discharge (Final Psychosocial Re-Evaluation):  Education: Education Goals: Education classes will be provided on a weekly basis, covering required topics. Participant will state understanding/return demonstration of topics presented.  Learning Barriers/Preferences: Learning Barriers/Preferences - 05/07/18 1123      Learning Barriers/Preferences   Learning Barriers  Sight   wears glasses   Learning Preferences  None       Education Topics:  Initial Evaluation Education: - Verbal, written and demonstration of respiratory meds, oximetry and breathing techniques. Instruction on use of nebulizers and MDIs and importance of monitoring MDI activations.   Pulmonary Rehab from 05/07/2018 in [facility] Cardiac and Pulmonary Rehab  Date  05/07/18  Educator  [facility]  Instruction Review Code  1- Verbalizes  Understanding      General Nutrition Guidelines/Fats and Fiber: -Group instruction provided by verbal, written material, models and posters to present the general guidelines for heart healthy nutrition. Gives an explanation and review of dietary fats and fiber.   Controlling Sodium/Reading Food Labels: -Group verbal and written material supporting the discussion of sodium use in heart healthy nutrition. Review and explanation with models, verbal and written materials for utilization of the food label.   Exercise Physiology & General Exercise Guidelines: - Group verbal and written instruction with models to review the exercise physiology of the cardiovascular system and associated critical values. Provides general exercise guidelines with specific guidelines to those with heart or lung disease.    Aerobic Exercise & Resistance Training: - Gives group verbal and written instruction on the various components of exercise. Focuses on aerobic and resistive training programs and the benefits of this training and how to safely progress through these programs.   Flexibility, Balance, Mind/Body Relaxation: Provides group verbal/written instruction on the benefits of flexibility and balance training, including mind/body exercise modes such as yoga, pilates and tai chi.  Demonstration and skill practice provided.   Stress and Anxiety: - Provides group verbal and written instruction about the health risks of elevated stress and causes of high stress.  Discuss the correlation between heart/lung disease and anxiety and treatment options. Review healthy ways to manage with stress and anxiety.   Depression: - Provides group verbal and written instruction on the correlation between heart/lung disease and depressed mood, treatment options, and the stigmas associated with seeking treatment.   Exercise & Equipment Safety: - Individual verbal instruction and demonstration of equipment use and safety with  use of the equipment.   Pulmonary Rehab from 05/07/2018 in [facility] Cardiac and Pulmonary Rehab  Date  05/07/18  Educator  [facility]  Instruction Review Code  1- Verbalizes Understanding      Infection Prevention: - Provides verbal and written material to individual with discussion of infection control including proper hand washing and proper equipment cleaning during exercise session.   Pulmonary Rehab from 05/07/2018 in [facility] Cardiac and Pulmonary Rehab  Date  05/07/18  Educator  [facility]  Instruction Review Code  1- Verbalizes Understanding      Falls Prevention: - Provides verbal and written material to individual with discussion of falls prevention and safety.   Pulmonary Rehab from 05/07/2018 in [facility] Cardiac and Pulmonary Rehab  Date  05/07/18  Educator  [facility]  Instruction Review Code  1- Verbalizes Understanding      Diabetes: - Individual verbal and written instruction to review signs/symptoms of diabetes, desired ranges of glucose level fasting, after meals and with exercise. Advice that pre and post exercise glucose checks will be done for 3 sessions at entry of program.   Chronic Lung Diseases: -  Group verbal and written instruction to review updates, respiratory medications, advancements in procedures and treatments. Discuss use of supplemental oxygen including available portable oxygen systems, continuous and intermittent flow rates, concentrators, personal use and safety guidelines. Review proper use of inhaler and spacers. Provide informative websites for self-education.    Energy Conservation: - Provide group verbal and written instruction for methods to conserve energy, plan and organize activities. Instruct on pacing techniques, use of adaptive equipment and posture/positioning to relieve shortness of breath.   Triggers and Exacerbations: - Group verbal and written instruction to review types of environmental triggers and ways to prevent exacerbations. Discuss weather changes, air  quality and the benefits of nasal washing. Review warning signs and symptoms to help prevent infections. Discuss techniques for effective airway clearance, coughing, and vibrations.   AED/CPR: - Group verbal and written instruction with the use of models to demonstrate the basic use of the AED with the basic ABC's of resuscitation.   Anatomy and Physiology of the Lungs: - Group verbal and written instruction with the use of models to provide basic lung anatomy and physiology related to function, structure and complications of lung disease.   Anatomy & Physiology of the Heart: - Group verbal and written instruction and models provide basic cardiac anatomy and physiology, with the coronary electrical and arterial systems. Review of Valvular disease and Heart Failure   Cardiac Medications: - Group verbal and written instruction to review commonly prescribed medications for heart disease. Reviews the medication, class of the drug, and side effects.   Know Your Numbers and Risk Factors: -Group verbal and written instruction about important numbers in your health.  Discussion of what are risk factors and how they play a role in the disease process.  Review of Cholesterol, Blood Pressure, Diabetes, and BMI and the role they play in your overall health.   Sleep Hygiene: -Provides group verbal and written instruction about how sleep can affect your health.  Define sleep hygiene, discuss sleep cycles and impact of sleep habits. Review good sleep hygiene tips.    Other: -Provides group and verbal instruction on various topics (see comments)    Knowledge Questionnaire Score: Knowledge Questionnaire Score - 05/07/18 1139      Knowledge Questionnaire Score   Pre Score  13/18   reviewed with patient       Core Components/Risk Factors/Patient Goals at Admission: Personal Goals and Risk Factors at Admission - 05/07/18 1124      Core Components/Risk Factors/Patient Goals on Admission     Weight Management  Yes;Weight Loss    Intervention  Weight Management: Develop a combined nutrition and exercise program designed to reach desired caloric intake, while maintaining appropriate intake of nutrient and fiber, sodium and fats, and appropriate energy expenditure required for the weight goal.;Weight Management: Provide education and appropriate resources to help participant work on and attain dietary goals.    Admit Weight  217 lb (98.4 kg)    Goal Weight: Short Term  212 lb (96.2 kg)    Goal Weight: Long Term  205 lb (93 kg)    Expected Outcomes  Short Term: Continue to assess and modify interventions until short term weight is achieved;Long Term: Adherence to nutrition and physical activity/exercise program aimed toward attainment of established weight goal;Weight Maintenance: Understanding of the daily nutrition guidelines, which includes 25-35% calories from fat, 7% or less cal from saturated fats, less than 280m cholesterol, less than 1.5gm of sodium, & 5 or more servings of fruits and vegetables  daily;Understanding recommendations for meals to include 15-35% energy as protein, 25-35% energy from fat, 35-60% energy from carbohydrates, less than '200mg'$  of dietary cholesterol, 20-35 gm of total fiber daily;Understanding of distribution of calorie intake throughout the day with the consumption of 4-5 meals/snacks    Improve shortness of breath with ADL's  Yes    Intervention  Provide education, individualized exercise plan and daily activity instruction to help decrease symptoms of SOB with activities of daily living.    Expected Outcomes  Short Term: Improve cardiorespiratory fitness to achieve a reduction of symptoms when performing ADLs;Long Term: Be able to perform more ADLs without symptoms or delay the onset of symptoms    Diabetes  Yes    Intervention  Provide education about signs/symptoms and action to take for hypo/hyperglycemia.;Provide education about proper nutrition, including  hydration, and aerobic/resistive exercise prescription along with prescribed medications to achieve blood glucose in normal ranges: Fasting glucose 65-99 mg/dL    Expected Outcomes  Short Term: Participant verbalizes understanding of the signs/symptoms and immediate care of hyper/hypoglycemia, proper foot care and importance of medication, aerobic/resistive exercise and nutrition plan for blood glucose control.;Long Term: Attainment of HbA1C < 7%.    Hypertension  Yes    Intervention  Provide education on lifestyle modifcations including regular physical activity/exercise, weight management, moderate sodium restriction and increased consumption of fresh fruit, vegetables, and low fat dairy, alcohol moderation, and smoking cessation.;Monitor prescription use compliance.    Expected Outcomes  Short Term: Continued assessment and intervention until BP is < 140/107m HG in hypertensive participants. < 130/838mHG in hypertensive participants with diabetes, heart failure or chronic kidney disease.;Long Term: Maintenance of blood pressure at goal levels.       Core Components/Risk Factors/Patient Goals Review:    Core Components/Risk Factors/Patient Goals at Discharge (Final Review):    ITP Comments: ITP Comments    Row Name 05/07/18 1043 05/13/18 0835 05/16/18 1521       ITP Comments  Medical Evaluation completed. Chart sent for review and changes to Dr. MaEmily Filbertirector of [facility]osis can be found in Epic in Media.  30 day review completed. ITP sent to Dr. MaEmily Filbertirector of [place] with ITP unless changes are made by physician.  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        Comments: Discharge ITP

## 2018-05-17 ENCOUNTER — Ambulatory Visit: Payer: No Typology Code available for payment source

## 2018-05-20 ENCOUNTER — Ambulatory Visit: Payer: No Typology Code available for payment source

## 2018-05-22 ENCOUNTER — Ambulatory Visit: Payer: No Typology Code available for payment source

## 2018-05-24 ENCOUNTER — Ambulatory Visit: Payer: No Typology Code available for payment source

## 2018-05-27 ENCOUNTER — Ambulatory Visit: Payer: No Typology Code available for payment source

## 2018-05-29 ENCOUNTER — Ambulatory Visit: Payer: No Typology Code available for payment source

## 2018-05-31 ENCOUNTER — Ambulatory Visit: Payer: No Typology Code available for payment source

## 2018-06-03 ENCOUNTER — Ambulatory Visit: Payer: No Typology Code available for payment source

## 2018-06-05 ENCOUNTER — Ambulatory Visit: Payer: No Typology Code available for payment source

## 2018-06-07 ENCOUNTER — Ambulatory Visit: Payer: No Typology Code available for payment source

## 2018-06-10 ENCOUNTER — Ambulatory Visit: Payer: No Typology Code available for payment source

## 2018-06-12 ENCOUNTER — Ambulatory Visit: Payer: No Typology Code available for payment source

## 2018-06-14 ENCOUNTER — Ambulatory Visit: Payer: No Typology Code available for payment source

## 2018-06-17 ENCOUNTER — Ambulatory Visit: Payer: BLUE CROSS/BLUE SHIELD | Admitting: Podiatry

## 2018-06-17 ENCOUNTER — Ambulatory Visit: Payer: No Typology Code available for payment source

## 2018-06-21 ENCOUNTER — Ambulatory Visit: Payer: No Typology Code available for payment source

## 2018-06-24 ENCOUNTER — Ambulatory Visit: Payer: No Typology Code available for payment source

## 2018-06-28 ENCOUNTER — Ambulatory Visit: Payer: No Typology Code available for payment source

## 2018-07-01 ENCOUNTER — Ambulatory Visit: Payer: No Typology Code available for payment source

## 2018-07-03 ENCOUNTER — Ambulatory Visit: Payer: No Typology Code available for payment source

## 2018-07-05 ENCOUNTER — Ambulatory Visit: Payer: No Typology Code available for payment source

## 2018-07-08 ENCOUNTER — Ambulatory Visit: Payer: No Typology Code available for payment source

## 2018-07-10 ENCOUNTER — Ambulatory Visit: Payer: No Typology Code available for payment source

## 2018-07-12 ENCOUNTER — Ambulatory Visit: Payer: No Typology Code available for payment source

## 2018-07-15 ENCOUNTER — Ambulatory Visit: Payer: No Typology Code available for payment source

## 2018-07-17 ENCOUNTER — Ambulatory Visit: Payer: No Typology Code available for payment source

## 2018-07-19 ENCOUNTER — Ambulatory Visit: Payer: No Typology Code available for payment source

## 2018-07-22 ENCOUNTER — Ambulatory Visit: Payer: No Typology Code available for payment source

## 2018-07-24 ENCOUNTER — Ambulatory Visit: Payer: No Typology Code available for payment source

## 2018-07-26 ENCOUNTER — Ambulatory Visit: Payer: No Typology Code available for payment source

## 2018-07-29 ENCOUNTER — Ambulatory Visit: Payer: No Typology Code available for payment source

## 2018-07-31 ENCOUNTER — Ambulatory Visit: Payer: No Typology Code available for payment source

## 2018-08-02 ENCOUNTER — Ambulatory Visit: Payer: No Typology Code available for payment source

## 2018-08-05 ENCOUNTER — Ambulatory Visit: Payer: No Typology Code available for payment source

## 2018-08-07 ENCOUNTER — Ambulatory Visit: Payer: No Typology Code available for payment source

## 2018-08-09 ENCOUNTER — Ambulatory Visit: Payer: No Typology Code available for payment source

## 2018-08-12 ENCOUNTER — Ambulatory Visit: Payer: No Typology Code available for payment source

## 2018-08-14 ENCOUNTER — Ambulatory Visit: Payer: No Typology Code available for payment source

## 2018-08-16 ENCOUNTER — Ambulatory Visit: Payer: No Typology Code available for payment source

## 2018-08-17 IMAGING — US US ABDOMEN COMPLETE
1 series · 14 of 25 positions shown · non-contrast
Comparison: CT 09/16/2014 .

CLINICAL DATA: Fatty liver.  History of leukemia .

EXAM:
ABDOMEN ULTRASOUND COMPLETE

[Series 1: us abdomen complete · 0.20mm/px · 14 of 99 slices shown]
[im 1/99]
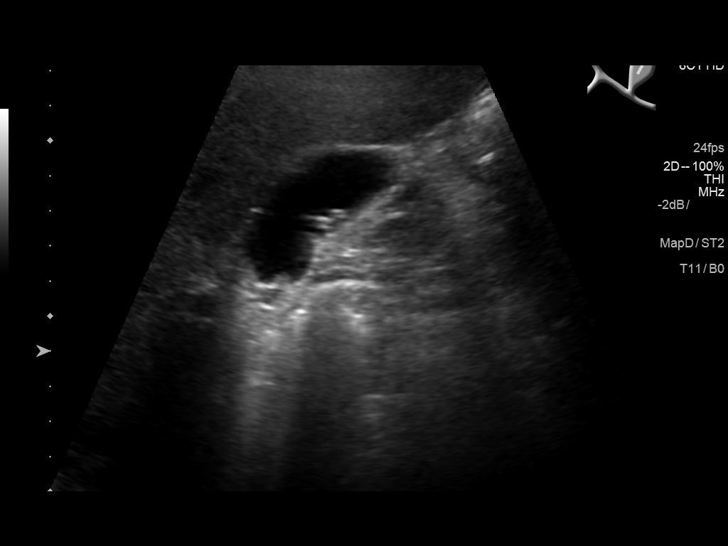
[im 9/99]
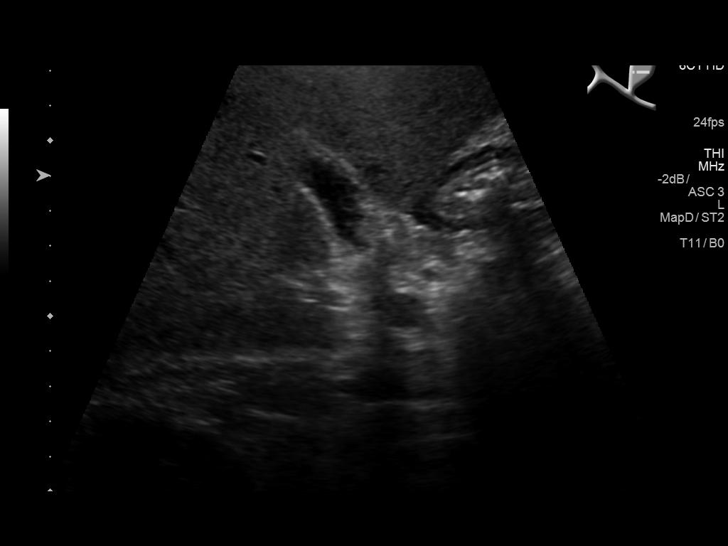
[im 17/99]
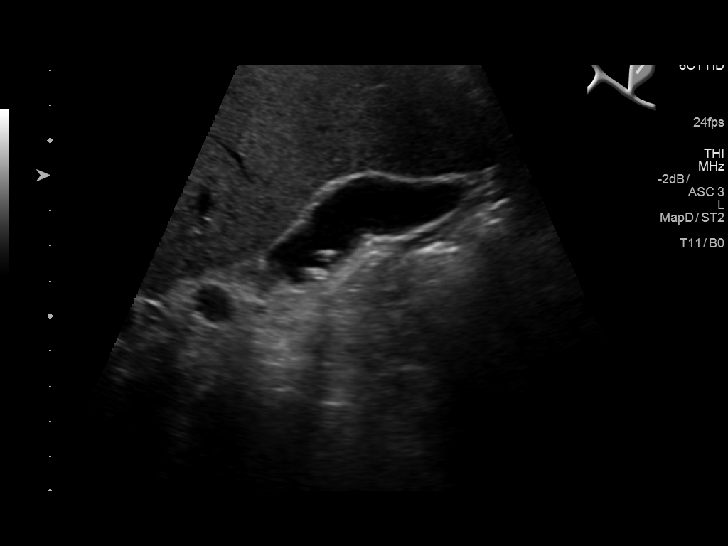
[im 25/99]
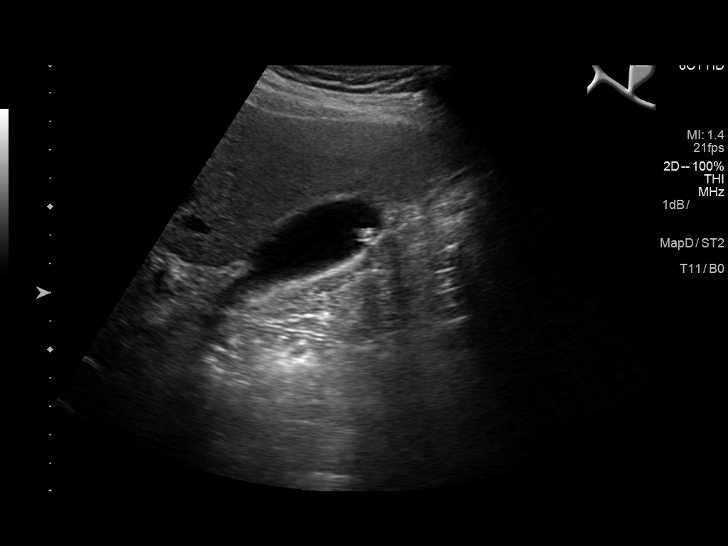
[im 33/99]
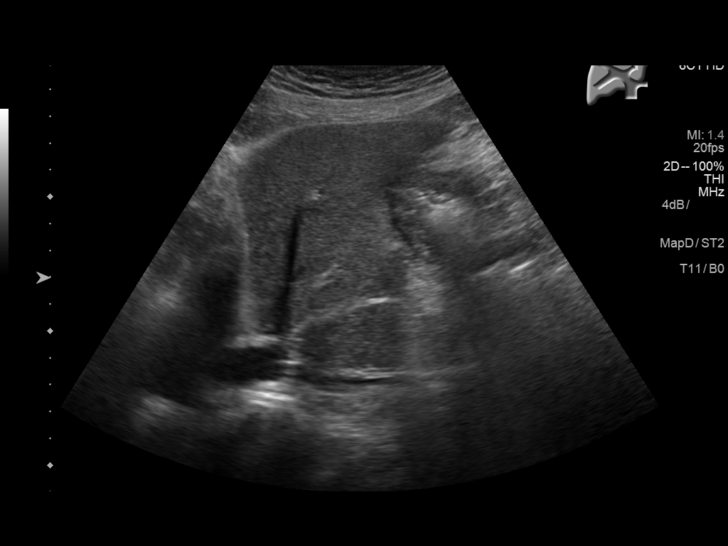
[im 37/99]
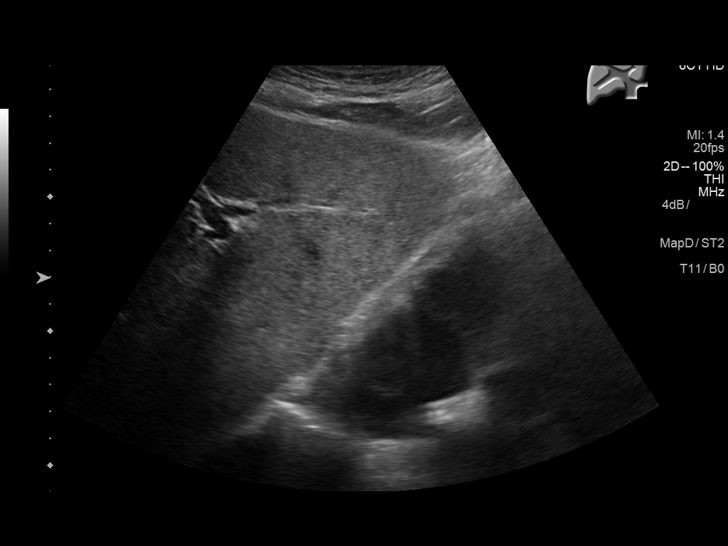
[im 45/99]
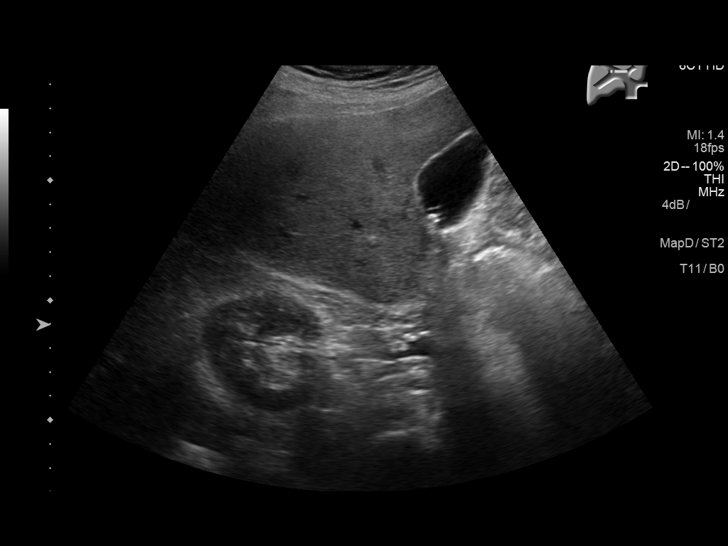
[im 54/99]
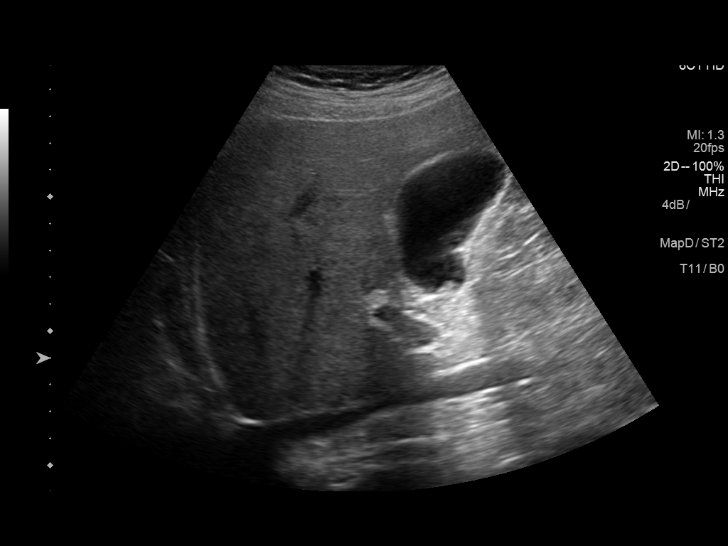
[im 62/99]
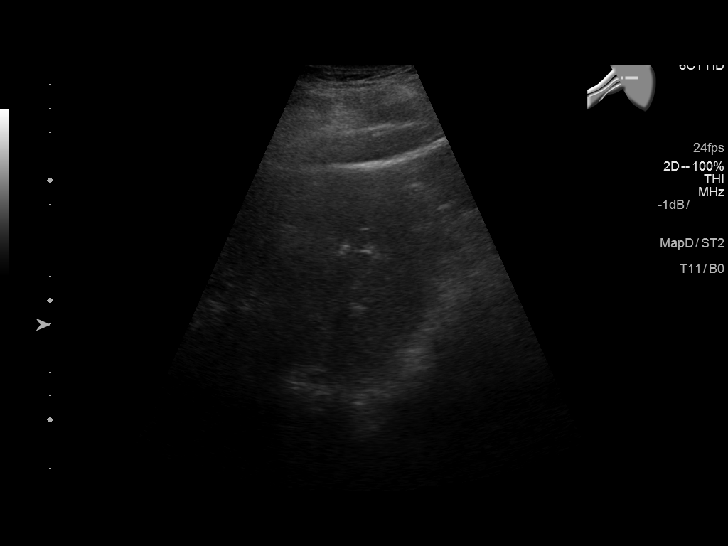
[im 66/99]
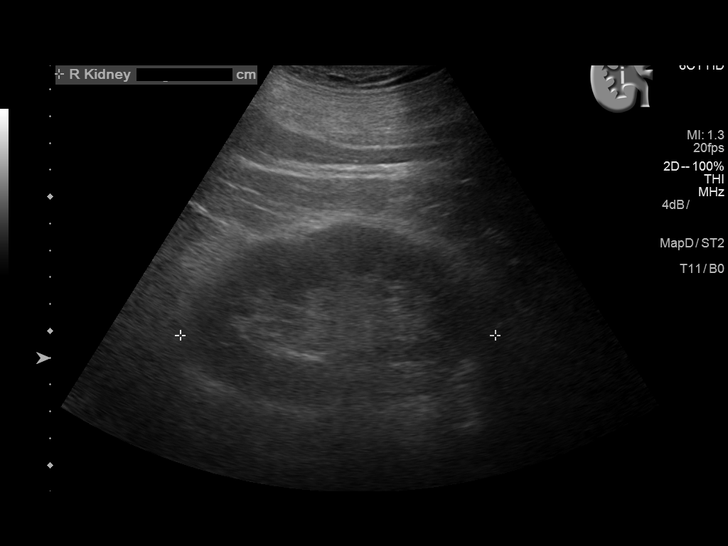
[im 74/99]
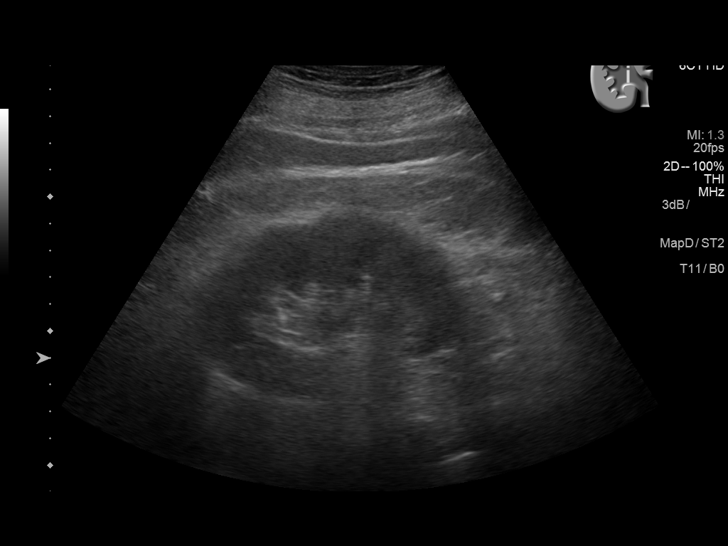
[im 82/99]
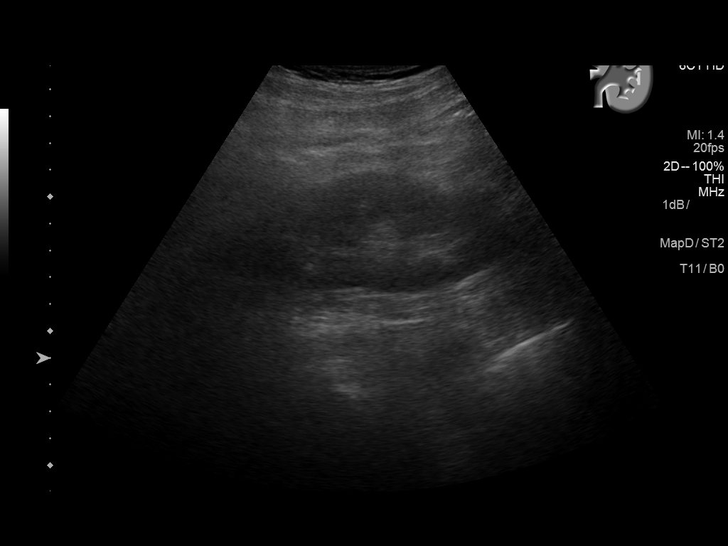
[im 90/99]
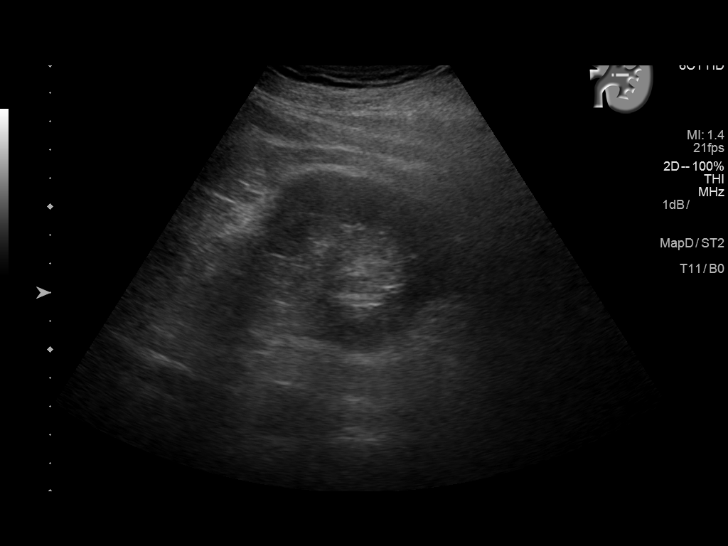
[im 99/99]
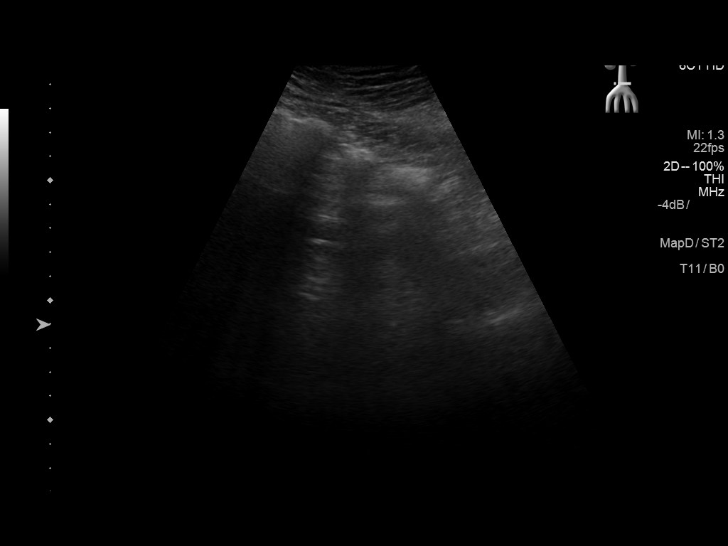

[14 of 25 positions shown; findings below may reference images not displayed]

FINDINGS: Gallbladder: Multiple gallstones measuring up to 5 mm noted.
Gallbladder sludge. Gallbladder wall thickness 1.6 mm. Negative
Murphy sign.

Common bile duct: Diameter: 4 mm

Liver: Slight increase echogenicity consistent fatty infiltration
and/or hepatocellular disease. No focal hepatic abnormality
identified.

IVC: No abnormality visualized.

Pancreas: Visualized portion unremarkable.

Spleen: Size and appearance within normal limits.

Right Kidney: Length: 11.7 cm. Echogenicity within normal limits. No
mass or hydronephrosis visualized.

Left Kidney: Length: 12.3 cm. Echogenicity within normal limits. No
mass or hydronephrosis visualized.

Abdominal aorta: No aneurysm visualized.

Other findings: Limited exam due to overlying bowel gas.
IMPRESSION: 1. Multiple gallstones and gallbladder sludge. No evidence of
cholecystitis or biliary distention.

2. Slight increase in hepatic echogenicity consistent fatty
infiltration and/or hepatocellular disease.

## 2018-08-19 ENCOUNTER — Ambulatory Visit: Payer: No Typology Code available for payment source

## 2018-08-21 ENCOUNTER — Ambulatory Visit: Payer: No Typology Code available for payment source

## 2018-08-23 ENCOUNTER — Ambulatory Visit: Payer: No Typology Code available for payment source

## 2018-08-26 ENCOUNTER — Ambulatory Visit: Payer: No Typology Code available for payment source

## 2018-08-28 ENCOUNTER — Ambulatory Visit: Payer: No Typology Code available for payment source

## 2018-08-30 ENCOUNTER — Ambulatory Visit: Payer: No Typology Code available for payment source

## 2018-09-02 ENCOUNTER — Ambulatory Visit: Payer: No Typology Code available for payment source

## 2018-09-04 ENCOUNTER — Ambulatory Visit: Payer: No Typology Code available for payment source

## 2019-09-17 ENCOUNTER — Ambulatory Visit: Admit: 2019-09-17 | Payer: No Typology Code available for payment source | Admitting: Internal Medicine

## 2019-09-17 SURGERY — ESOPHAGOGASTRODUODENOSCOPY (EGD) WITH PROPOFOL
Anesthesia: General

## 2023-10-09 ENCOUNTER — Inpatient Hospital Stay
Admission: EM | Admit: 2023-10-09 | Discharge: 2023-10-12 | DRG: 871 | Disposition: A | Discharge status: Home Health | Attending: Internal Medicine | Admitting: Internal Medicine

## 2023-10-09 ENCOUNTER — Emergency Department

## 2023-10-09 ENCOUNTER — Other Ambulatory Visit: Payer: Self-pay

## 2023-10-09 ENCOUNTER — Observation Stay

## 2023-10-09 DIAGNOSIS — C679 Malignant neoplasm of bladder, unspecified: Secondary | ICD-10-CM | POA: Insufficient documentation

## 2023-10-09 DIAGNOSIS — R6521 Severe sepsis with septic shock: Secondary | ICD-10-CM

## 2023-10-09 DIAGNOSIS — L97419 Non-pressure chronic ulcer of right heel and midfoot with unspecified severity: Secondary | ICD-10-CM | POA: Diagnosis not present

## 2023-10-09 DIAGNOSIS — Z856 Personal history of leukemia: Secondary | ICD-10-CM

## 2023-10-09 DIAGNOSIS — E46 Unspecified protein-calorie malnutrition: Secondary | ICD-10-CM | POA: Diagnosis present

## 2023-10-09 DIAGNOSIS — K5791 Diverticulosis of intestine, part unspecified, without perforation or abscess with bleeding: Secondary | ICD-10-CM

## 2023-10-09 DIAGNOSIS — Z515 Encounter for palliative care: Secondary | ICD-10-CM

## 2023-10-09 DIAGNOSIS — E1122 Type 2 diabetes mellitus with diabetic chronic kidney disease: Secondary | ICD-10-CM | POA: Diagnosis present

## 2023-10-09 DIAGNOSIS — A419 Sepsis, unspecified organism: Secondary | ICD-10-CM | POA: Diagnosis not present

## 2023-10-09 DIAGNOSIS — L97511 Non-pressure chronic ulcer of other part of right foot limited to breakdown of skin: Secondary | ICD-10-CM

## 2023-10-09 DIAGNOSIS — Z604 Social exclusion and rejection: Secondary | ICD-10-CM | POA: Diagnosis present

## 2023-10-09 DIAGNOSIS — Z8551 Personal history of malignant neoplasm of bladder: Secondary | ICD-10-CM

## 2023-10-09 DIAGNOSIS — R7989 Other specified abnormal findings of blood chemistry: Secondary | ICD-10-CM

## 2023-10-09 DIAGNOSIS — Z1152 Encounter for screening for COVID-19: Secondary | ICD-10-CM

## 2023-10-09 DIAGNOSIS — K8020 Calculus of gallbladder without cholecystitis without obstruction: Secondary | ICD-10-CM | POA: Diagnosis present

## 2023-10-09 DIAGNOSIS — G9341 Metabolic encephalopathy: Secondary | ICD-10-CM | POA: Diagnosis present

## 2023-10-09 DIAGNOSIS — R54 Age-related physical debility: Secondary | ICD-10-CM | POA: Diagnosis present

## 2023-10-09 DIAGNOSIS — Z7401 Bed confinement status: Secondary | ICD-10-CM

## 2023-10-09 DIAGNOSIS — N179 Acute kidney failure, unspecified: Secondary | ICD-10-CM

## 2023-10-09 DIAGNOSIS — G9340 Encephalopathy, unspecified: Secondary | ICD-10-CM

## 2023-10-09 DIAGNOSIS — E11621 Type 2 diabetes mellitus with foot ulcer: Secondary | ICD-10-CM | POA: Diagnosis present

## 2023-10-09 DIAGNOSIS — I708 Atherosclerosis of other arteries: Secondary | ICD-10-CM | POA: Diagnosis present

## 2023-10-09 DIAGNOSIS — L03115 Cellulitis of right lower limb: Principal | ICD-10-CM

## 2023-10-09 DIAGNOSIS — Z87891 Personal history of nicotine dependence: Secondary | ICD-10-CM

## 2023-10-09 DIAGNOSIS — Z6820 Body mass index (BMI) 20.0-20.9, adult: Secondary | ICD-10-CM

## 2023-10-09 DIAGNOSIS — N1832 Chronic kidney disease, stage 3b: Secondary | ICD-10-CM | POA: Diagnosis present

## 2023-10-09 DIAGNOSIS — S91301A Unspecified open wound, right foot, initial encounter: Secondary | ICD-10-CM

## 2023-10-09 DIAGNOSIS — Z66 Do not resuscitate: Secondary | ICD-10-CM | POA: Diagnosis present

## 2023-10-09 DIAGNOSIS — Z860101 Personal history of adenomatous and serrated colon polyps: Secondary | ICD-10-CM

## 2023-10-09 DIAGNOSIS — I129 Hypertensive chronic kidney disease with stage 1 through stage 4 chronic kidney disease, or unspecified chronic kidney disease: Secondary | ICD-10-CM | POA: Diagnosis present

## 2023-10-09 DIAGNOSIS — L89616 Pressure-induced deep tissue damage of right heel: Secondary | ICD-10-CM | POA: Diagnosis present

## 2023-10-09 DIAGNOSIS — Z800 Family history of malignant neoplasm of digestive organs: Secondary | ICD-10-CM

## 2023-10-09 DIAGNOSIS — N210 Calculus in bladder: Secondary | ICD-10-CM | POA: Diagnosis present

## 2023-10-09 DIAGNOSIS — I10 Essential (primary) hypertension: Secondary | ICD-10-CM | POA: Insufficient documentation

## 2023-10-09 DIAGNOSIS — E669 Obesity, unspecified: Secondary | ICD-10-CM | POA: Diagnosis present

## 2023-10-09 LAB — CBC WITH DIFFERENTIAL/PLATELET
Abs Immature Granulocytes: 0.01 10*3/uL (ref 0.00–0.07)
Basophils Absolute: 0.1 10*3/uL (ref 0.0–0.1)
Basophils Relative: 1 %
Eosinophils Absolute: 0.2 10*3/uL (ref 0.0–0.5)
Eosinophils Relative: 4 %
HCT: 26.9 % — ABNORMAL LOW (ref 39.0–52.0)
Hemoglobin: 9.2 g/dL — ABNORMAL LOW (ref 13.0–17.0)
Immature Granulocytes: 0 %
Lymphocytes Relative: 16 %
Lymphs Abs: 0.8 10*3/uL (ref 0.7–4.0)
MCH: 33.3 pg (ref 26.0–34.0)
MCHC: 34.2 g/dL (ref 30.0–36.0)
MCV: 97.5 fL (ref 80.0–100.0)
Monocytes Absolute: 0.6 10*3/uL (ref 0.1–1.0)
Monocytes Relative: 12 %
Neutro Abs: 3.2 10*3/uL (ref 1.7–7.7)
Neutrophils Relative %: 67 %
Platelets: 210 10*3/uL (ref 150–400)
RBC: 2.76 MIL/uL — ABNORMAL LOW (ref 4.22–5.81)
RDW: 18.1 % — ABNORMAL HIGH (ref 11.5–15.5)
WBC: 4.9 10*3/uL (ref 4.0–10.5)
nRBC: 0 % (ref 0.0–0.2)

## 2023-10-09 LAB — RESP PANEL BY RT-PCR (RSV, FLU A&B, COVID)  RVPGX2
Influenza A by PCR: NEGATIVE
Influenza B by PCR: NEGATIVE
Resp Syncytial Virus by PCR: NEGATIVE
SARS Coronavirus 2 by RT PCR: NEGATIVE

## 2023-10-09 LAB — COMPREHENSIVE METABOLIC PANEL WITH GFR
ALT: 12 U/L (ref 0–44)
AST: 28 U/L (ref 15–41)
Albumin: 2.4 g/dL — ABNORMAL LOW (ref 3.5–5.0)
Alkaline Phosphatase: 60 U/L (ref 38–126)
Anion gap: 10 (ref 5–15)
BUN: 26 mg/dL — ABNORMAL HIGH (ref 8–23)
CO2: 31 mmol/L (ref 22–32)
Calcium: 8 mg/dL — ABNORMAL LOW (ref 8.9–10.3)
Chloride: 94 mmol/L — ABNORMAL LOW (ref 98–111)
Creatinine, Ser: 2.02 mg/dL — ABNORMAL HIGH (ref 0.61–1.24)
GFR, Estimated: 33 mL/min — ABNORMAL LOW (ref >60)
Glucose, Bld: 124 mg/dL — ABNORMAL HIGH (ref 70–99)
Potassium: 3.9 mmol/L (ref 3.5–5.1)
Sodium: 135 mmol/L (ref 135–145)
Total Bilirubin: 0.6 mg/dL (ref 0.0–1.2)
Total Protein: 5.1 g/dL — ABNORMAL LOW (ref 6.5–8.1)

## 2023-10-09 LAB — TROPONIN I (HIGH SENSITIVITY)
Troponin I (High Sensitivity): 50 ng/L — ABNORMAL HIGH (ref <18)
Troponin I (High Sensitivity): 45 ng/L — ABNORMAL HIGH (ref <18)

## 2023-10-09 LAB — APTT: aPTT: 41 s — ABNORMAL HIGH (ref 24–36)

## 2023-10-09 LAB — PROTIME-INR
INR: 1.1 (ref 0.8–1.2)
Prothrombin Time: 14 s (ref 11.4–15.2)

## 2023-10-09 LAB — LACTIC ACID, PLASMA: Lactic Acid, Venous: 1.9 mmol/L (ref 0.5–1.9)

## 2023-10-09 LAB — MRSA NEXT GEN BY PCR, NASAL: MRSA by PCR Next Gen: NOT DETECTED

## 2023-10-09 MED ORDER — ACETAMINOPHEN 325 MG PO TABS
650.0000 mg | ORAL_TABLET | Freq: Four times a day (QID) | ORAL | Status: DC | PRN
  Administered 2023-10-09: 650 mg via ORAL
  Filled 2023-10-09: qty 2

## 2023-10-09 MED ORDER — SODIUM CHLORIDE 0.9 % IV BOLUS (SEPSIS)
250.0000 mL | Freq: Once | INTRAVENOUS | Status: AC
  Administered 2023-10-09: 250 mL via INTRAVENOUS

## 2023-10-09 MED ORDER — SODIUM CHLORIDE 0.9 % IV BOLUS (SEPSIS)
1000.0000 mL | Freq: Once | INTRAVENOUS | Status: AC
  Administered 2023-10-09: 1000 mL via INTRAVENOUS

## 2023-10-09 MED ORDER — ONDANSETRON HCL 4 MG/2ML IJ SOLN: 4.0000 mg | Freq: Four times a day (QID) | INTRAMUSCULAR | Status: DC | PRN

## 2023-10-09 MED ORDER — VANCOMYCIN HCL 500 MG/100ML IV SOLN
500.0000 mg | Freq: Once | INTRAVENOUS | Status: AC
  Administered 2023-10-09: 500 mg via INTRAVENOUS
  Filled 2023-10-09: qty 100

## 2023-10-09 MED ORDER — GADOBUTROL 1 MMOL/ML IV SOLN
6.0000 mL | Freq: Once | INTRAVENOUS | Status: AC | PRN
  Administered 2023-10-09: 6 mL via INTRAVENOUS

## 2023-10-09 MED ORDER — GLUCERNA SHAKE PO LIQD: 237.0000 mL | Freq: Three times a day (TID) | ORAL | Status: DC

## 2023-10-09 MED ORDER — ENOXAPARIN SODIUM 30 MG/0.3ML IJ SOSY
30.0000 mg | PREFILLED_SYRINGE | INTRAMUSCULAR | Status: DC
  Administered 2023-10-09: 30 mg via SUBCUTANEOUS
  Filled 2023-10-09: qty 0.3

## 2023-10-09 MED ORDER — CHLORHEXIDINE GLUCONATE CLOTH 2 % EX PADS
6.0000 | MEDICATED_PAD | Freq: Every day | CUTANEOUS | Status: DC
  Administered 2023-10-10 – 2023-10-12 (×3): 6 via TOPICAL

## 2023-10-09 MED ORDER — METRONIDAZOLE 500 MG/100ML IV SOLN
500.0000 mg | Freq: Two times a day (BID) | INTRAVENOUS | Status: DC
  Administered 2023-10-09 – 2023-10-10 (×2): 500 mg via INTRAVENOUS
  Filled 2023-10-09 (×2): qty 100

## 2023-10-09 MED ORDER — ONDANSETRON HCL 4 MG PO TABS: 4.0000 mg | ORAL_TABLET | Freq: Four times a day (QID) | ORAL | Status: DC | PRN

## 2023-10-09 MED ORDER — LACTATED RINGERS IV BOLUS
500.0000 mL | Freq: Once | INTRAVENOUS | Status: AC
  Administered 2023-10-09: 500 mL via INTRAVENOUS

## 2023-10-09 MED ORDER — VANCOMYCIN HCL IN DEXTROSE 1-5 GM/200ML-% IV SOLN
1000.0000 mg | Freq: Once | INTRAVENOUS | Status: AC
  Administered 2023-10-09: 1000 mg via INTRAVENOUS
  Filled 2023-10-09: qty 200

## 2023-10-09 MED ORDER — SODIUM CHLORIDE 0.9 % IV SOLN
2.0000 g | Freq: Once | INTRAVENOUS | Status: AC
  Administered 2023-10-09: 2 g via INTRAVENOUS
  Filled 2023-10-09: qty 20

## 2023-10-09 MED ORDER — SODIUM CHLORIDE 0.9 % IV SOLN
2.0000 g | INTRAVENOUS | Status: DC
  Administered 2023-10-09: 2 g via INTRAVENOUS
  Filled 2023-10-09 (×2): qty 12.5

## 2023-10-09 MED ORDER — LACTATED RINGERS IV SOLN
150.0000 mL/h | INTRAVENOUS | Status: DC
  Administered 2023-10-09: 150 mL/h via INTRAVENOUS

## 2023-10-09 NOTE — Assessment & Plan Note (Signed)
 Baseline history of bladder cancer Pending CT of the abdomen pelvis to assess for any urinary retention in setting of sepsis and decreased urine output despite aggressive IV fluid hydration Monitor

## 2023-10-09 NOTE — Progress Notes (Signed)
 Pt returned back from MRI of R foot. Sheela Denmark BSN RN CMSRN 10/09/2023, 10:38 PM

## 2023-10-09 NOTE — Consult Note (Signed)
 CODE SEPSIS - PHARMACY COMMUNICATION  **Broad-spectrum antimicrobials should be administered within one hour of sepsis diagnosis**  Time Code Sepsis call or page was received: 1318  Antibiotics ordered: Ceftriaxone  Time of first antibiotic administration: 1343  Additional action taken by pharmacy: N/A  If necessary, name of provider/nurse contacted: N/A    Nicolas Mayo, PharmD Clinical Pharmacist 10/09/2023 1:27 PM

## 2023-10-09 NOTE — ED Triage Notes (Signed)
 Pt presents top ED via [facility] from home with c/o of R ankle skin ulcer, ulcer "bursted" 1 month ago, on and off bleeding per home health care nurse.   Pt has HX of bladder CA, and leukemia.   Pt pale in color on arrival. Foley in place on arrival.

## 2023-10-09 NOTE — H&P (Addendum)
 History and Physical    Patient: Nicolas Mayo QMV:[id number] DOB: 09-Dec-1940 DOA: 10/09/2023 DOS: the patient was seen and examined on 10/09/2023 PCP: Deveron Fly, MD (Inactive)  Patient coming from: Home  Chief Complaint:  Chief Complaint  Patient presents with   Skin Ulcer   HPI: [name] is a 83 y.o. male with medical history significant of GERD, diverticulosis, bladder cancer and leukemia type 2 diabetes, hypertension presenting with sepsis, right heel wound, encephalopathy.  Limited history in the setting of sepsis and encephalopathy.  History from patient as well as wife.  Per report, patient with noted right heel wound.  Has been on outpatient antibiotics for treatment.  Still worsening redness and pain.  No reported trauma.  Noted worsening weakness and fatigue at home.  No reported fevers or chills.  No reported nausea or vomiting.  Area has persistently pleated especially over the past few days.  And EMS subsequently called.  Per the wife, she has also been in touch with palliative care to discuss with goals of care versus hospice for the patient. Presented to the ER Tmax 96.5, heart rate 90s to 100s, respirations 20s to 30s, BP 70s to 110s systolic.  Satting well on room air.  White count 4.9, hemoglobin 9.2, platelets 210, creatinine 2.02, glucose 124.  Troponin 40s.  EKG sinus tach. Review of Systems: unable to review all systems due to the inability of the patient to answer questions. Past Medical History:  Diagnosis Date   Blood dyscrasia    Cancer (HCC)    leukemia   Colon polyp    Diabetes mellitus without complication (HCC)    Diverticulosis    Diverticulosis    Duodenitis    Duodenitis    Duodenitis    Esophagitis    Gastropathy    Gastropathy    GERD (gastroesophageal reflux disease)    Schatzki's ring 08/18/2014   Schatzki's ring    Tubulovillous adenoma polyp of colon    Past Surgical History:  Procedure Laterality Date    APPENDECTOMY     BACK SURGERY     COLON SURGERY     COLONOSCOPY     COLONOSCOPY WITH PROPOFOL N/A 04/20/2016   Procedure: COLONOSCOPY WITH PROPOFOL;  Surgeon: Deveron Fly, MD;  Location: [facility] ENDOSCOPY;  Service: Endoscopy;  Laterality: N/A;   disectomy     ESOPHAGOGASTRODUODENOSCOPY     ESOPHAGOGASTRODUODENOSCOPY N/A 01/04/2015   Procedure: ESOPHAGOGASTRODUODENOSCOPY (EGD);  Surgeon: Deveron Fly, MD;  Location: [facility] ENDOSCOPY;  Service: Endoscopy;  Laterality: N/A;   ESOPHAGOGASTRODUODENOSCOPY (EGD) WITH PROPOFOL N/A 01/11/2016   Procedure: ESOPHAGOGASTRODUODENOSCOPY (EGD) WITH PROPOFOL;  Surgeon: Deveron Fly, MD;  Location: [facility] ENDOSCOPY;  Service: Endoscopy;  Laterality: N/A;   ESOPHAGOGASTRODUODENOSCOPY (EGD) WITH PROPOFOL N/A 01/02/2017   Procedure: ESOPHAGOGASTRODUODENOSCOPY (EGD) WITH PROPOFOL;  Surgeon: Deveron Fly, MD;  Location: [facility] ENDOSCOPY;  Service: Endoscopy;  Laterality: N/A;   ESOPHAGOGASTRODUODENOSCOPY (EGD) WITH PROPOFOL N/A 07/30/2017   Procedure: ESOPHAGOGASTRODUODENOSCOPY (EGD) WITH PROPOFOL;  Surgeon: Deveron Fly, MD;  Location: [facility] ENDOSCOPY;  Service: Endoscopy;  Laterality: N/A;   partial amputation of finger     pilonidial cyst surgery     resection of colon polyps     TONSILLECTOMY     Social History:  reports that he quit smoking about 5 years ago. His smoking use included cigarettes. He started smoking about 65 years ago. He has a 60 pack-year smoking history. He has never used smokeless tobacco. He reports current alcohol  use of about 14.0 standard drinks of alcohol per week. He reports that he does not use drugs.  No Known Allergies  Family History  Problem Relation Age of Onset   Stomach cancer Mother    Colon cancer Father    Ulcers Father     Prior to Admission medications   Medication Sig Start Date End Date Taking? Authorizing Provider  cholecalciferol (VITAMIN D) 1000 units tablet Take 1,000 Units by mouth daily.  Vit. D 3   Yes [provider]  DULoxetine (CYMBALTA) 20 MG capsule Take 20 mg by mouth 2 (two) times daily.   Yes [provider]  ferrous sulfate 325 (65 FE) MG tablet Take 325 mg by mouth 3 (three) times a week. Take 1 tablet (325 mg) by mouth every Monday, Wednesday, and Friday   Yes [provider]  finasteride (PROSCAR) 5 MG tablet Take 5 mg by mouth daily. 05/15/23  Yes [provider]  furosemide (LASIX) 20 MG tablet Take 20 mg by mouth 3 (three) times daily. Take 1 tablet (20mg ) by mouth every Monday, Wednesday, and Friday   Yes [provider]  imatinib (GLEEVEC) 100 MG tablet Take 300 mg by mouth daily. Reported on 01/10/2016   Yes [provider]  magnesium oxide (MAG-OX) 400 (240 Mg) MG tablet Take 400 mg by mouth daily.   Yes [provider]  Multiple Vitamins-Minerals (PRESERVISION AREDS 2 PO) Take 2 capsules by mouth daily.   Yes [provider]  pantoprazole (PROTONIX) 40 MG tablet Take 40 mg by mouth daily. Reported on 01/11/2016   Yes [provider]  pregabalin (LYRICA) 75 MG capsule Take 75 mg by mouth 2 (two) times daily. 06/29/23  Yes [provider]  tamsulosin (FLOMAX) 0.4 MG CAPS capsule Take 0.4 mg by mouth daily. 05/15/23  Yes [provider]  albuterol (PROVENTIL, VENTOLIN) (5 MG/ML) 0.5% NEBU Inhale into the lungs.    [provider]  amLODipine (NORVASC) 5 MG tablet Take by mouth. Patient not taking: Reported on 10/09/2023    [provider]  UNABLE TO FIND Take by mouth.    [provider]  valsartan (DIOVAN) 40 MG tablet Take 40 mg by mouth daily.     [provider]  vitamin B-12 (CYANOCOBALAMIN) 1000 MCG tablet Take 1,000 mcg by mouth daily.    [provider]    Physical Exam: Vitals:   10/09/23 1430 10/09/23 1500 10/09/23 1530 10/09/23 1620  BP: (!) 95/56 (!) 104/57 112/63 (!) 116/57  Pulse: 93 94 92 95  Resp: (!) 23  (!) 31 (!) 27 18  Temp: (!) 96.9 F (36.1 C) (!) 96.5 F (35.8 C) (!) 96.9 F (36.1 C) (!) 97.3 F (36.3 C)  SpO2: 100% 100% 100% 100%  Weight:      Height:       Physical Exam Constitutional:      Appearance: He is obese.     Comments: Mild pallor and confusion    HENT:     Head: Normocephalic and atraumatic.     Mouth/Throat:     Mouth: Mucous membranes are moist.  Eyes:     Pupils: Pupils are equal, round, and reactive to light.  Cardiovascular:     Rate and Rhythm: Normal rate and regular rhythm.  Pulmonary:     Effort: Pulmonary effort is normal.  Abdominal:     General: Bowel sounds are normal.  Musculoskeletal:        General: Normal  range of motion.  Skin:    Comments: See picture    Neurological:     General: No focal deficit present.  Psychiatric:        Mood and Affect: Mood normal.     Data Reviewed:  There are no new results to review at this time.  DG Chest Port 1 View CLINICAL DATA:  Questionable sepsis.  Evaluate for abnormality.  EXAM: PORTABLE CHEST 1 VIEW  COMPARISON:  Radiographs 01/08/2008.  FINDINGS: 1348 hours. Two views submitted. The heart size and mediastinal contours are normal. There is a small left pleural effusion with mild left basilar atelectasis. No confluent airspace disease or pneumothorax. The right lung appears clear. There are degenerative changes in the spine without evidence of acute osseous abnormality.  IMPRESSION: Small left pleural effusion with mild left basilar atelectasis. No confluent airspace disease.  Electronically Signed   By: Elmon Hagedorn M.D.   On: 10/09/2023 16:39 DG Foot Complete Right CLINICAL DATA:  Right ankle skin ulcer.  EXAM: RIGHT FOOT COMPLETE - 3+ VIEW  COMPARISON:  None Available.  FINDINGS: Soft tissue ulceration at the heel. No evidence of acute osteolysis or erosive changes. No acute fracture or dislocation. Mild first MTP and diffuse interphalangeal joint space  narrowing. Mild degenerative changes of the midfoot.  IMPRESSION: Soft tissue swelling and ulceration at the heel. No evidence of acute osteomyelitis.  Electronically Signed   By: Mannie Seek M.D.   On: 10/09/2023 16:32  Lab Results  Component Value Date   WBC 4.9 10/09/2023   HGB 9.2 (L) 10/09/2023   HCT 26.9 (L) 10/09/2023   MCV 97.5 10/09/2023   PLT 210 10/09/2023   Last metabolic panel Lab Results  Component Value Date   GLUCOSE 124 (H) 10/09/2023   NA 135 10/09/2023   K 3.9 10/09/2023   CL 94 (L) 10/09/2023   CO2 31 10/09/2023   BUN 26 (H) 10/09/2023   CREATININE 2.02 (H) 10/09/2023   GFRNONAA 33 (L) 10/09/2023   CALCIUM 8.0 (L) 10/09/2023   PROT 5.1 (L) 10/09/2023   ALBUMIN 2.4 (L) 10/09/2023   BILITOT 0.6 10/09/2023   ALKPHOS 60 10/09/2023   AST 28 10/09/2023   ALT 12 10/09/2023   ANIONGAP 10 10/09/2023    Assessment and Plan: * Sepsis (HCC) Meeting sepsis criteria with HR 100s, RR 20s,  WBC 4.9  Lactate 1.9  Some concern for RLE wound infection  Will place on broad spectrum antibiotics  Will check MRI R heel to better assess  Pan-culture      Non-healing open wound of right heel Worsening R heel wound  + soft tissue swelling on imaging  Will place on broad spectrum antibiotics including cefepime, flagyl and vancomycin in setting of sepsis  Will check MRI heel to better assess  Panculture in setting of sepsis.  Monitor     Encephalopathy Positive generalized lethargy and confusion on presentation Suspect multifactorial contributions to sepsis Suspect underlying baseline dementia given age and comorbidities Treat infection IV fluid hydration CT head x 1 CT of the abdomen pelvis also pending in the setting of to rule out urinary retention  monitor  Elevated troponin Trop 40s-50s on presentation  EKG stable  Suspect minimal to mild demand ischemia in setting of sepsis  Trend troponin  Follow   AKI (acute kidney injury)  (HCC) Creatinine 2 on presentation Unclear of general baseline Baseline bladder cancer is a confounding issue CT abdomen pelvis pending to better assess IV fluid hydration Hold  nephrotoxic agents Urinary serum studies as appropriate Follow  Bladder cancer (HCC) Baseline history of bladder cancer Pending CT of the abdomen pelvis to assess for any urinary retention in setting of sepsis and decreased urine output despite aggressive IV fluid hydration Monitor   HTN (hypertension) LLN BP on presentation  Hold BP regimen     Greater than 50% was spent in counseling and coordination of care with patient Total encounter time 80 minutes or more     Advance Care Planning:   Code Status: Limited: Do not attempt resuscitation (DNR) -DNR-LIMITED -Do Not Intubate/DNI    Consults: None   Family Communication: Plan of care discussed with wife over the phone.  She reports his POA for the patient and reinforces DNR status.  Does report prior conversation about palliative versus hospice for the patient.  Will also consult palliative care while in house.  Severity of Illness: The appropriate patient status for this patient is OBSERVATION. Observation status is judged to be reasonable and necessary in order to provide the required intensity of service to ensure the patient's safety. The patient's presenting symptoms, physical exam findings, and initial radiographic and laboratory data in the context of their medical condition is felt to place them at decreased risk for further clinical deterioration. Furthermore, it is anticipated that the patient will be medically stable for discharge from the hospital within 2 midnights of admission.   Author: Corrinne Din, MD 10/09/2023 5:33 PM  For on call review [email or web address].

## 2023-10-09 NOTE — Consult Note (Signed)
 Pharmacy Antibiotic Note  Nicolas Mayo is a 83 y.o. male admitted on 10/09/2023 with sepsis due to nonhealing open wound on right heel.  Pharmacy has been consulted for Vancomycin dosing.  Plan: Vancomycin 1g IV x 1 given in ED. Will order additional 500mg  IV to equal loading dose of 1500mg  total.  - Patient in AKI currently, so will avoid scheduled AUC dosing currently and dose via random levels. - Random level ordered for 4/16@1900 . Can cancel level and adjust dosing if renal function returns to baseline tomorrow  Height: 6' (182.9 cm) Weight: 68 kg (150 lb) IBW/kg (Calculated) : 77.6  Temp (24hrs), Avg:97.2 F (36.2 C), Min:96.5 F (35.8 C), Max:98.6 F (37 C)  Recent Labs  Lab 10/09/23 1325 10/09/23 1327  WBC 4.9  --   CREATININE 2.02*  --   LATICACIDVEN  --  1.9    Estimated Creatinine Clearance: 27.6 mL/min (A) (by C-G formula based on SCr of 2.02 mg/dL (H)).    No Known Allergies  Antimicrobials this admission: Cefepime 4/15 >>  Vancomycin 4/15 >>  Ceftriaxone 4/15 x 1  Dose adjustments this admission: N/A  Microbiology results: 4/15 BCx: collected 4/15 MRSA PCR: pending  Thank you for allowing pharmacy to be a part of this patient's care.  Wyatt Thorstenson A Andreah Goheen 10/09/2023 5:30 PM

## 2023-10-09 NOTE — ED Notes (Signed)
 Lab called due to blood cultures not being collected previously. Per lab phlebotomist is coming to collect due to difficult collection.

## 2023-10-09 NOTE — Assessment & Plan Note (Signed)
 Creatinine 2 on presentation Unclear of general baseline Baseline bladder cancer is a confounding issue CT abdomen pelvis pending to better assess IV fluid hydration Hold nephrotoxic agents Urinary serum studies as appropriate Follow

## 2023-10-09 NOTE — Assessment & Plan Note (Signed)
 Positive generalized lethargy and confusion on presentation Suspect multifactorial contributions to sepsis Suspect underlying baseline dementia given age and comorbidities Treat infection IV fluid hydration CT head x 1 CT of the abdomen pelvis also pending in the setting of to rule out urinary retention  monitor

## 2023-10-09 NOTE — Assessment & Plan Note (Signed)
LLN BP on presentation  Hold BP regimen

## 2023-10-09 NOTE — Assessment & Plan Note (Signed)
 Meeting sepsis criteria with HR 100s, RR 20s,  WBC 4.9  Lactate 1.9  Some concern for RLE wound infection  Will place on broad spectrum antibiotics  Will check MRI R heel to better assess  Pan-culture

## 2023-10-09 NOTE — Assessment & Plan Note (Signed)
 Trop 40s-50s on presentation  EKG stable  Suspect minimal to mild demand ischemia in setting of sepsis  Trend troponin  Follow

## 2023-10-09 NOTE — Sepsis Progress Note (Signed)
 Elink monitoring for the code sepsis protocol.

## 2023-10-09 NOTE — ED Notes (Signed)
 MD made aware that pt has not produced urine since arriving to ED. RN bladder scanned pt and bladder scan showed in bladder. Indwelling catheter flushed successfully. Pt states this catheter was changed today.

## 2023-10-09 NOTE — ED Notes (Signed)
 Pt taken to CT.

## 2023-10-09 NOTE — ED Provider Notes (Signed)
 [facility] Provider Note    Event Date/Time   First MD Initiated Contact with Patient 10/09/23 1316     (approximate)   History   Chief Complaint: Skin Ulcer   HPI  Nicolas Mayo is a 83 y.o. male with a history of leukemia, diabetes, GERD who comes to the ED due to generalized weakness as well as swelling and worsening wound of the right foot.  Patient complains of pain in bilateral legs, 8/10 that is chronic.  No chest pain shortness of breath or fever.        Past Medical History:  Diagnosis Date   Blood dyscrasia    Cancer (HCC)    leukemia   Colon polyp    Diabetes mellitus without complication (HCC)    Diverticulosis    Diverticulosis    Duodenitis    Duodenitis    Duodenitis    Esophagitis    Gastropathy    Gastropathy    GERD (gastroesophageal reflux disease)    Schatzki's ring 08/18/2014   Schatzki's ring    Tubulovillous adenoma polyp of colon     Current Outpatient Rx   Order #: [id number] Class: Historical Med   Order #: [id number] Class: Historical Med   Order #: [id number] Class: Historical Med   Order #: [id number] Class: Historical Med   Order #: [id number] Class: Historical Med   Order #: [id number] Class: Historical Med   Order #: [id number] Class: Historical Med   Order #: [id number] Class: Historical Med   Order #: [id number] Class: Historical Med    Past Surgical History:  Procedure Laterality Date   APPENDECTOMY     BACK SURGERY     COLON SURGERY     COLONOSCOPY     COLONOSCOPY WITH PROPOFOL N/A 04/20/2016   Procedure: COLONOSCOPY WITH PROPOFOL;  Surgeon: Christena Deem, MD;  Location: [facility] ENDOSCOPY;  Service: Endoscopy;  Laterality: N/A;   disectomy     ESOPHAGOGASTRODUODENOSCOPY     ESOPHAGOGASTRODUODENOSCOPY N/A 01/04/2015   Procedure: ESOPHAGOGASTRODUODENOSCOPY (EGD);  Surgeon: Christena Deem, MD;  Location: [facility] ENDOSCOPY;  Service: Endoscopy;  Laterality: N/A;   ESOPHAGOGASTRODUODENOSCOPY (EGD) WITH  PROPOFOL N/A 01/11/2016   Procedure: ESOPHAGOGASTRODUODENOSCOPY (EGD) WITH PROPOFOL;  Surgeon: Christena Deem, MD;  Location: [facility] ENDOSCOPY;  Service: Endoscopy;  Laterality: N/A;   ESOPHAGOGASTRODUODENOSCOPY (EGD) WITH PROPOFOL N/A 01/02/2017   Procedure: ESOPHAGOGASTRODUODENOSCOPY (EGD) WITH PROPOFOL;  Surgeon: Christena Deem, MD;  Location: [facility] ENDOSCOPY;  Service: Endoscopy;  Laterality: N/A;   ESOPHAGOGASTRODUODENOSCOPY (EGD) WITH PROPOFOL N/A 07/30/2017   Procedure: ESOPHAGOGASTRODUODENOSCOPY (EGD) WITH PROPOFOL;  Surgeon: Christena Deem, MD;  Location: [facility] ENDOSCOPY;  Service: Endoscopy;  Laterality: N/A;   partial amputation of finger     pilonidial cyst surgery     resection of colon polyps     TONSILLECTOMY      Physical Exam   Triage Vital Signs: ED Triage Vitals  Encounter Vitals Group     BP 10/09/23 1304 (!) 70/57     Systolic BP Percentile --      Diastolic BP Percentile --      Pulse Rate 10/09/23 1304 (!) 101     Resp 10/09/23 1304 (!) 25     Temp --      Temp src --      SpO2 10/09/23 1304 97 %     Weight 10/09/23 1301 150 lb (68 kg)     Height 10/09/23 1301 6' (1.829 m)     Head Circumference --  Peak Flow --      Pain Score 10/09/23 1259 8     Pain Loc --      Pain Education --      Exclude from Growth Chart --     Most recent vital signs: Vitals:   10/09/23 1330 10/09/23 1430  BP: (!) 95/54 (!) 95/56  Pulse: 99 93  Resp: 20 (!) 23  Temp: 98.6 F (37 C) (!) 96.9 F (36.1 C)  SpO2: 100% 100%    General: Awake, no distress.  CV:  Good peripheral perfusion.  Tachycardia heart rate 105, normal distal pulses Resp:  Normal effort.  Tachypnea, respiratory rate of 25.  Lungs clear to auscultation bilaterally Abd:  No distention.  Soft with suprapubic tenderness Other:  Stage I skin breakdown over the sacrum.  Spontaneous bowel movement in his brief with brown stool. Right foot has multiple areas of tissue necrosis including over bony  prominence at the proximal fifth metatarsal, distal fifth metatarsal, and over the heel.  Heel wound has exposed deep soft tissue, no purulence or crepitus   ED Results / Procedures / Treatments   Labs (all labs ordered are listed, but only abnormal results are displayed) Labs Reviewed  COMPREHENSIVE METABOLIC PANEL WITH GFR - Abnormal; Notable for the following components:      Result Value   Chloride 94 (*)    Glucose, Bld 124 (*)    BUN 26 (*)    Creatinine, Ser 2.02 (*)    Calcium 8.0 (*)    Total Protein 5.1 (*)    Albumin 2.4 (*)    GFR, Estimated 33 (*)    All other components within normal limits  CBC WITH DIFFERENTIAL/PLATELET - Abnormal; Notable for the following components:   RBC 2.76 (*)    Hemoglobin 9.2 (*)    HCT 26.9 (*)    RDW 18.1 (*)    All other components within normal limits  TROPONIN I (HIGH SENSITIVITY) - Abnormal; Notable for the following components:   Troponin I (High Sensitivity) 50 (*)    All other components within normal limits  RESP PANEL BY RT-PCR (RSV, FLU A&B, COVID)  RVPGX2  CULTURE, BLOOD (ROUTINE X 2)  CULTURE, BLOOD (ROUTINE X 2)  LACTIC ACID, PLASMA  PROTIME-INR  URINALYSIS, W/ REFLEX TO CULTURE (INFECTION SUSPECTED)  TROPONIN I (HIGH SENSITIVITY)     EKG Interpreted by me Sinus tachycardia rate 101, left axis, prolonged QTc of 528 ms.  Right bundle branch block.  No acute ischemic changes.   RADIOLOGY X-ray right foot interpreted by me, shows some soft tissue edema without subcu gas or bony erosion.  Radiology report reviewed   PROCEDURES:  .Critical Care  Performed by: Sharman Cheek, MD Authorized by: Sharman Cheek, MD   Critical care provider statement:    Critical care time (minutes):  35   Critical care time was exclusive of:  Separately billable procedures and treating other patients   Critical care was necessary to treat or prevent imminent or life-threatening deterioration of the following conditions:   Sepsis and shock   Critical care was time spent personally by me on the following activities:  Development of treatment plan with patient or surrogate, discussions with consultants, evaluation of patient's response to treatment, examination of patient, obtaining history from patient or surrogate, ordering and performing treatments and interventions, ordering and review of laboratory studies, ordering and review of radiographic studies, pulse oximetry, re-evaluation of patient's condition and review of old charts  MEDICATIONS ORDERED IN ED: Medications  vancomycin (VANCOCIN) IVPB 1000 mg/200 mL premix (has no administration in time range)  sodium chloride 0.9 % bolus 1,000 mL (1,000 mLs Intravenous New Bag/Given 10/09/23 1342)    And  sodium chloride 0.9 % bolus 1,000 mL (1,000 mLs Intravenous New Bag/Given 10/09/23 1345)    And  sodium chloride 0.9 % bolus 250 mL (250 mLs Intravenous New Bag/Given 10/09/23 1420)  cefTRIAXone (ROCEPHIN) 2 g in sodium chloride 0.9 % 100 mL IVPB (0 g Intravenous Stopped 10/09/23 1416)     IMPRESSION / MDM / ASSESSMENT AND PLAN / ED COURSE  I reviewed the triage vital signs and the nursing notes.  DDx: Right foot cellulitis/abscess, osteomyelitis, UTI, sepsis, dehydration, anemia, electrolyte derangement, COVID, influenza, pneumonia   Patient's presentation is most consistent with acute presentation with potential threat to life or bodily function.  Patient presents with generalized weakness, right foot appears infected, multiple vital sign abnormalities concerning for septic shock.  Will give empiric Rocephin along with 2 L IV fluid bolus while obtaining x-rays and labs.   ----------------------------------------- 3:12 PM on 10/09/2023 ----------------------------------------- Blood pressure improved to 105/80 with fluids.  Lactate normal.  Fluids ongoing.  Shock state has improved, No sign of refractory shock.  Discussed with hospitalist for further  management.      FINAL CLINICAL IMPRESSION(S) / ED DIAGNOSES   Final diagnoses:  Cellulitis of right foot  Septic shock (HCC)     Rx / DC Orders   ED Discharge Orders     None        Note:  This document was prepared using Dragon voice recognition software and may include unintentional dictation errors.   Jacquie Maudlin, MD 10/09/23 [id number]

## 2023-10-09 NOTE — Assessment & Plan Note (Signed)
 Worsening R heel wound  + soft tissue swelling on imaging  Will place on broad spectrum antibiotics including cefepime, flagyl and vancomycin in setting of sepsis  Will check MRI heel to better assess  Panculture in setting of sepsis.  Monitor

## 2023-10-09 NOTE — ED Notes (Signed)
 Lab called to collect 2nd set of blood cultures.

## 2023-10-10 ENCOUNTER — Observation Stay

## 2023-10-10 DIAGNOSIS — G9340 Encephalopathy, unspecified: Secondary | ICD-10-CM | POA: Diagnosis not present

## 2023-10-10 DIAGNOSIS — E46 Unspecified protein-calorie malnutrition: Secondary | ICD-10-CM | POA: Diagnosis present

## 2023-10-10 DIAGNOSIS — L97511 Non-pressure chronic ulcer of other part of right foot limited to breakdown of skin: Secondary | ICD-10-CM | POA: Diagnosis not present

## 2023-10-10 DIAGNOSIS — R54 Age-related physical debility: Secondary | ICD-10-CM | POA: Diagnosis present

## 2023-10-10 DIAGNOSIS — Z604 Social exclusion and rejection: Secondary | ICD-10-CM | POA: Diagnosis present

## 2023-10-10 DIAGNOSIS — A419 Sepsis, unspecified organism: Secondary | ICD-10-CM

## 2023-10-10 DIAGNOSIS — I129 Hypertensive chronic kidney disease with stage 1 through stage 4 chronic kidney disease, or unspecified chronic kidney disease: Secondary | ICD-10-CM | POA: Diagnosis present

## 2023-10-10 DIAGNOSIS — S91301A Unspecified open wound, right foot, initial encounter: Secondary | ICD-10-CM

## 2023-10-10 DIAGNOSIS — Z7189 Other specified counseling: Secondary | ICD-10-CM | POA: Diagnosis not present

## 2023-10-10 DIAGNOSIS — N179 Acute kidney failure, unspecified: Secondary | ICD-10-CM | POA: Diagnosis present

## 2023-10-10 DIAGNOSIS — Z800 Family history of malignant neoplasm of digestive organs: Secondary | ICD-10-CM | POA: Diagnosis not present

## 2023-10-10 DIAGNOSIS — Z7401 Bed confinement status: Secondary | ICD-10-CM | POA: Diagnosis not present

## 2023-10-10 DIAGNOSIS — G9341 Metabolic encephalopathy: Secondary | ICD-10-CM | POA: Diagnosis present

## 2023-10-10 DIAGNOSIS — N1832 Chronic kidney disease, stage 3b: Secondary | ICD-10-CM | POA: Diagnosis present

## 2023-10-10 DIAGNOSIS — E11621 Type 2 diabetes mellitus with foot ulcer: Secondary | ICD-10-CM | POA: Diagnosis present

## 2023-10-10 DIAGNOSIS — Z66 Do not resuscitate: Secondary | ICD-10-CM | POA: Diagnosis present

## 2023-10-10 DIAGNOSIS — Z515 Encounter for palliative care: Secondary | ICD-10-CM | POA: Diagnosis not present

## 2023-10-10 DIAGNOSIS — L89616 Pressure-induced deep tissue damage of right heel: Secondary | ICD-10-CM | POA: Diagnosis present

## 2023-10-10 DIAGNOSIS — Z87891 Personal history of nicotine dependence: Secondary | ICD-10-CM | POA: Diagnosis not present

## 2023-10-10 DIAGNOSIS — Z856 Personal history of leukemia: Secondary | ICD-10-CM | POA: Diagnosis not present

## 2023-10-10 DIAGNOSIS — L03115 Cellulitis of right lower limb: Secondary | ICD-10-CM | POA: Diagnosis present

## 2023-10-10 DIAGNOSIS — Z1152 Encounter for screening for COVID-19: Secondary | ICD-10-CM | POA: Diagnosis not present

## 2023-10-10 DIAGNOSIS — E1122 Type 2 diabetes mellitus with diabetic chronic kidney disease: Secondary | ICD-10-CM | POA: Diagnosis present

## 2023-10-10 DIAGNOSIS — L97419 Non-pressure chronic ulcer of right heel and midfoot with unspecified severity: Secondary | ICD-10-CM | POA: Diagnosis present

## 2023-10-10 DIAGNOSIS — E669 Obesity, unspecified: Secondary | ICD-10-CM | POA: Diagnosis present

## 2023-10-10 DIAGNOSIS — C679 Malignant neoplasm of bladder, unspecified: Secondary | ICD-10-CM

## 2023-10-10 DIAGNOSIS — Z860101 Personal history of adenomatous and serrated colon polyps: Secondary | ICD-10-CM | POA: Diagnosis not present

## 2023-10-10 DIAGNOSIS — Z8551 Personal history of malignant neoplasm of bladder: Secondary | ICD-10-CM | POA: Diagnosis not present

## 2023-10-10 DIAGNOSIS — I708 Atherosclerosis of other arteries: Secondary | ICD-10-CM | POA: Diagnosis present

## 2023-10-10 LAB — CBC
HCT: 22.6 % — ABNORMAL LOW (ref 39.0–52.0)
Hemoglobin: 8 g/dL — ABNORMAL LOW (ref 13.0–17.0)
MCH: 33.2 pg (ref 26.0–34.0)
MCHC: 35.4 g/dL (ref 30.0–36.0)
MCV: 93.8 fL (ref 80.0–100.0)
Platelets: 177 10*3/uL (ref 150–400)
RBC: 2.41 MIL/uL — ABNORMAL LOW (ref 4.22–5.81)
RDW: 17.9 % — ABNORMAL HIGH (ref 11.5–15.5)
WBC: 5.3 10*3/uL (ref 4.0–10.5)
nRBC: 0 % (ref 0.0–0.2)

## 2023-10-10 LAB — COMPREHENSIVE METABOLIC PANEL WITH GFR
ALT: 11 U/L (ref 0–44)
AST: 31 U/L (ref 15–41)
Albumin: 1.9 g/dL — ABNORMAL LOW (ref 3.5–5.0)
Alkaline Phosphatase: 52 U/L (ref 38–126)
Anion gap: 8 (ref 5–15)
BUN: 25 mg/dL — ABNORMAL HIGH (ref 8–23)
CO2: 28 mmol/L (ref 22–32)
Calcium: 7.3 mg/dL — ABNORMAL LOW (ref 8.9–10.3)
Chloride: 99 mmol/L (ref 98–111)
Creatinine, Ser: 1.9 mg/dL — ABNORMAL HIGH (ref 0.61–1.24)
GFR, Estimated: 35 mL/min — ABNORMAL LOW (ref >60)
Glucose, Bld: 81 mg/dL (ref 70–99)
Potassium: 3.5 mmol/L (ref 3.5–5.1)
Sodium: 135 mmol/L (ref 135–145)
Total Bilirubin: 0.9 mg/dL (ref 0.0–1.2)
Total Protein: 4.3 g/dL — ABNORMAL LOW (ref 6.5–8.1)

## 2023-10-10 MED ORDER — PREGABALIN 75 MG PO CAPS
75.0000 mg | ORAL_CAPSULE | Freq: Two times a day (BID) | ORAL | Status: DC
  Administered 2023-10-10 – 2023-10-12 (×5): 75 mg via ORAL
  Filled 2023-10-10 (×5): qty 1

## 2023-10-10 MED ORDER — CEPHALEXIN 500 MG PO CAPS: 500.0000 mg | ORAL_CAPSULE | Freq: Two times a day (BID) | ORAL | Status: DC

## 2023-10-10 MED ORDER — MAGNESIUM OXIDE -MG SUPPLEMENT 400 (240 MG) MG PO TABS
400.0000 mg | ORAL_TABLET | Freq: Every day | ORAL | Status: DC
  Administered 2023-10-10 – 2023-10-12 (×3): 400 mg via ORAL
  Filled 2023-10-10 (×3): qty 1

## 2023-10-10 MED ORDER — ENSURE ENLIVE PO LIQD
237.0000 mL | Freq: Two times a day (BID) | ORAL | Status: DC
Start: 2023-10-10 — End: 2023-10-12
  Administered 2023-10-10 – 2023-10-12 (×5): 237 mL via ORAL

## 2023-10-10 MED ORDER — VANCOMYCIN VARIABLE DOSE PER UNSTABLE RENAL FUNCTION (PHARMACIST DOSING): Status: DC

## 2023-10-10 MED ORDER — VITAMIN B-12 1000 MCG PO TABS
1000.0000 ug | ORAL_TABLET | Freq: Every day | ORAL | Status: DC
  Administered 2023-10-10 – 2023-10-12 (×3): 1000 ug via ORAL
  Filled 2023-10-10 (×3): qty 1

## 2023-10-10 MED ORDER — CEPHALEXIN 500 MG PO CAPS
500.0000 mg | ORAL_CAPSULE | Freq: Three times a day (TID) | ORAL | Status: DC
  Administered 2023-10-10 – 2023-10-12 (×6): 500 mg via ORAL
  Filled 2023-10-10 (×6): qty 1

## 2023-10-10 MED ORDER — PROSIGHT PO TABS
1.0000 | ORAL_TABLET | Freq: Every day | ORAL | Status: DC
  Administered 2023-10-10 – 2023-10-12 (×3): 1 via ORAL
  Filled 2023-10-10 (×3): qty 1

## 2023-10-10 MED ORDER — PANTOPRAZOLE SODIUM 40 MG PO TBEC
40.0000 mg | DELAYED_RELEASE_TABLET | Freq: Every day | ORAL | Status: DC
  Administered 2023-10-10 – 2023-10-12 (×3): 40 mg via ORAL
  Filled 2023-10-10 (×3): qty 1

## 2023-10-10 MED ORDER — FINASTERIDE 5 MG PO TABS
5.0000 mg | ORAL_TABLET | Freq: Every day | ORAL | Status: DC
  Administered 2023-10-10 – 2023-10-12 (×3): 5 mg via ORAL
  Filled 2023-10-10 (×3): qty 1

## 2023-10-10 MED ORDER — DULOXETINE HCL 20 MG PO CPEP
20.0000 mg | ORAL_CAPSULE | Freq: Two times a day (BID) | ORAL | Status: DC
  Administered 2023-10-10 – 2023-10-12 (×5): 20 mg via ORAL
  Filled 2023-10-10 (×5): qty 1

## 2023-10-10 MED ORDER — FERROUS SULFATE 325 (65 FE) MG PO TABS
325.0000 mg | ORAL_TABLET | ORAL | Status: DC
  Administered 2023-10-10 – 2023-10-12 (×2): 325 mg via ORAL
  Filled 2023-10-10 (×2): qty 1

## 2023-10-10 MED ORDER — IMATINIB MESYLATE 100 MG PO TABS: 300.0000 mg | ORAL_TABLET | Freq: Every day | ORAL | Status: DC

## 2023-10-10 MED ORDER — VITAMIN D 25 MCG (1000 UNIT) PO TABS
1000.0000 [IU] | ORAL_TABLET | Freq: Every day | ORAL | Status: DC
  Administered 2023-10-10 – 2023-10-12 (×3): 1000 [IU] via ORAL
  Filled 2023-10-10 (×3): qty 1

## 2023-10-10 MED ORDER — TAMSULOSIN HCL 0.4 MG PO CAPS
0.4000 mg | ORAL_CAPSULE | Freq: Every day | ORAL | Status: DC
  Administered 2023-10-10 – 2023-10-12 (×3): 0.4 mg via ORAL
  Filled 2023-10-10 (×3): qty 1

## 2023-10-10 NOTE — TOC Progression Note (Signed)
 Transition of Care [facility]) - Progression Note    Patient Details  Name: Nicolas Mayo MRN: [id number] Date of Birth: 14-Aug-1940  [facility] of [facility] CM/SW Contact  Alexandra Ice, RN Phone Number: 10/10/2023, 4:52 PM  Clinical Narrative:     Sent email to [facility] liaison to determine services patient has in place, awaiting response,.        Expected Discharge Plan and Services                                               Social Determinants of Health (SDOH) Interventions SDOH Screenings   Food Insecurity: No Food Insecurity (10/09/2023)  Housing: Low Risk  (10/09/2023)  Transportation Needs: No Transportation Needs (10/09/2023)  Utilities: Not At Risk (10/09/2023)  Depression (PHQ2-9): Low Risk  (04/21/2019)  Social Connections: Socially Isolated (10/09/2023)  Tobacco Use: Medium Risk (10/09/2023)    Readmission Risk Interventions     No data to display

## 2023-10-10 NOTE — Progress Notes (Signed)
 [facility]  - [place] [facility]   PATIENT NAME: Nicolas Mayo    MR#:  [id number]  DATE OF BIRTH:  1940-12-07  SUBJECTIVE:  wife and son at bedside. Patient with bleeding from right heel ulcer. He is bedbound for couple years. No fever. Was evaluated by podiatry.    VITALS:  Blood pressure (!) 148/99, pulse 82, temperature 97.7 F (36.5 C), resp. rate 18, height 6' (1.829 m), weight 68 kg, SpO2 98%.  PHYSICAL EXAMINATION:   GENERAL:  83 y.o.-year-old patient with no acute distress. Appears chronically ill frail  LUNGS: Normal breath sounds bilaterally, no wheezing CARDIOVASCULAR: S1, S2 normal. No murmur   ABDOMEN: Soft, nontender, nondistended. Bowel sounds present.  EXTREMITIES:right heel  NEUROLOGIC: nonfocal  patient is alert and awake SKINas as above  LABORATORY PANEL:  CBC Recent Labs  Lab 10/10/23 0423  WBC 5.3  HGB 8.0*  HCT 22.6*  PLT 177    Chemistries  Recent Labs  Lab 10/10/23 0423  NA 135  K 3.5  CL 99  CO2 28  GLUCOSE 81  BUN 25*  CREATININE 1.90*  CALCIUM 7.3*  AST 31  ALT 11  ALKPHOS 52  BILITOT 0.9   Cardiac Enzymes No results for input(s): "TROPONINI" in the last 168 hours. RADIOLOGY:  US ARTERIAL ABI (SCREENING LOWER EXTREMITY) Result Date: 10/10/2023 CLINICAL DATA:  Hypertension. Right vascular ulcer. Diabetes. Former tobacco user. EXAM: NONINVASIVE PHYSIOLOGIC VASCULAR STUDY OF BILATERAL LOWER EXTREMITIES TECHNIQUE: Evaluation of both lower extremities were performed at rest, including calculation of ankle-brachial indices with single level pressure measurements and doppler recording. COMPARISON:  None available. FINDINGS: Right ABI:  1.13 Left ABI:  2.01 Right Lower Extremity:  Normal arterial waveforms at the ankle. Left Lower Extremity:  Normal arterial waveforms at the ankle. 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 Normal right  ankle-brachial indices. 2. Nondiagnostic evaluation of the left lower extremity due to incompressible calcified vessels. Electronically Signed   By: Acquanetta Belling M.D.   On: 10/10/2023 12:02   MR HEEL RIGHT W WO CONTRAST Result Date: 10/10/2023 CLINICAL DATA:  Soft tissue infection. Leukemia. Diabetes. Weakness. Swelling. EXAM: MR OF THE RIGHT HEEL WITHOUT AND WITH CONTRAST TECHNIQUE: Multiplanar, multisequence MR imaging of the ankle was performed before and after the administration of intravenous contrast. CONTRAST:  6mL GADAVIST GADOBUTROL 1 MMOL/ML IV SOLN COMPARISON:  Radiographs 10/09/2023 FINDINGS: TENDONS Peroneal: Mild common peroneus tendon sheath tenosynovitis. Posteromedial: Distal tibialis posterior tenosynovitis. Mild expansion of the distal tibialis posterior tendon favoring tendinopathy. There is also flexor digitorum longus and flexor hallucis longus tenosynovitis. Anterior: Unremarkable Achilles: Minimal distal Achilles tendinopathy. Plantar Fascia: Mild thickening of the medial band of plantar fascia with mildly accentuated T2 signal within the fascia proximally on image 10 series 10 compatible with plantar fasciitis. Low-level edema in the underlying subcutaneous tissues. LIGAMENTS Lateral: Unremarkable Medial: Unremarkable CARTILAGE Ankle Joint: Unremarkable Subtalar Joints/Sinus Tarsi: Mild edema in the sinus tarsi although less than is typically seen in the setting of sinus tarsi syndrome. Small effusion of the posterior subtalar joint medially as on images 12-13 of series 10. Bones: Mild dorsal spurring at the talonavicular articulation. No marrow edema or enhancement to suggest osteomyelitis. Other: Wound along the posterior heel with some subcutaneous thinning in this vicinity as well as overlying bandaging. Subcutaneous edema and enhancement along the posteroinferior heel favor cellulitis. No drainable abscess or underlying osteomyelitis. IMPRESSION: IMPRESSION 1. Wound along the  posterior heel with some subcutaneous thinning in this vicinity as well as overlying bandaging. Subcutaneous edema and enhancement along the posteroinferior heel favor cellulitis. No drainable abscess or underlying osteomyelitis. 2. Mild plantar fasciitis. 3. Mild common peroneus tendon sheath tenosynovitis. 4. Distal tibialis posterior tenosynovitis with mild expansion of the distal tibialis posterior tendon favoring tendinopathy. There is also flexor digitorum longus and flexor hallucis longus tenosynovitis. 5. Minimal distal Achilles tendinopathy. 6. Mild edema in the sinus tarsi although less than is typically seen in the setting of sinus tarsi syndrome. 7. Small effusion of the posterior subtalar joint medially. No enhancing margins to suggest septic joint. Electronically Signed   By: Gaylyn Rong M.D.   On: 10/10/2023 08:47   CT ABDOMEN PELVIS WO CONTRAST Result Date: 10/09/2023 CLINICAL DATA:  Sepsis EXAM: CT ABDOMEN AND PELVIS WITHOUT CONTRAST TECHNIQUE: Multidetector CT imaging of the abdomen and pelvis was performed following the standard protocol without IV contrast. RADIATION DOSE REDUCTION: This exam was performed according to the departmental dose-optimization program which includes automated exposure control, adjustment of the mA and/or kV according to patient size and/or use of iterative reconstruction technique. COMPARISON:  09/16/2014 FINDINGS: Lower chest: Clustered nodular densities peripherally in the left lower lobe. Probable scarring in the lingula. Right lung base clear. No effusions. Hepatobiliary: No focal hepatic abnormality. Small layering gallstones. No CT evidence of acute cholecystitis. No biliary ductal dilatation. Pancreas: No focal abnormality or ductal dilatation. Spleen: No focal abnormality.  Normal size. Adrenals/Urinary Tract: No renal or adrenal abnormality. No renal or ureteral stones or hydronephrosis. Small stones noted layering posteriorly in the bladder near the  left UVJ. Air within the urinary bladder presumably from recent catheterization. Stomach/Bowel: Stomach, large and small bowel grossly unremarkable. Postoperative changes in the right colon and transverse colon. No bowel obstruction or inflammatory process. Vascular/Lymphatic: Diffuse aortoiliac atherosclerosis. No evidence of aneurysm or adenopathy. Reproductive: No visible focal abnormality. Other: No free fluid or free air. Musculoskeletal: No acute bony abnormality. IMPRESSION: Small layering bladder stones dependently near the left UVJ. No ureteral stones or hydronephrosis. Cholelithiasis.  No CT evidence of acute cholecystitis. Aortoiliac atherosclerosis. Clustered tree-in-bud nodular densities in the left lower lobe, likely small airways disease. Electronically Signed   By: Charlett Nose M.D.   On: 10/09/2023 19:01   DG Chest Port 1 View Result Date: 10/09/2023 CLINICAL DATA:  Questionable sepsis.  Evaluate for abnormality. EXAM: PORTABLE CHEST 1 VIEW COMPARISON:  Radiographs 01/08/2008. FINDINGS: 1348 hours. Two views submitted. The heart size and mediastinal contours are normal. There is a small left pleural effusion with mild left basilar atelectasis. No confluent airspace disease or pneumothorax. The right lung appears clear. There are degenerative changes in the spine without evidence of acute osseous abnormality. IMPRESSION: Small left pleural effusion with mild left basilar atelectasis. No confluent airspace disease. Electronically Signed   By: Carey Bullocks M.D.   On: 10/09/2023 16:39   DG Foot Complete Right Result Date: 10/09/2023 CLINICAL DATA:  Right ankle skin ulcer. EXAM: RIGHT FOOT COMPLETE - 3+ VIEW COMPARISON:  None Available. FINDINGS: Soft tissue ulceration at the heel. No evidence of acute osteolysis or erosive changes. No acute fracture or dislocation. Mild first MTP and diffuse interphalangeal joint space narrowing. Mild degenerative changes of the midfoot. IMPRESSION: Soft tissue  swelling and ulceration at the heel. No evidence of acute osteomyelitis. Electronically Signed   By: Hart Robinsons M.D.   On: 10/09/2023 16:32    Assessment and Plan  Deniro Laymon Fini is  a 83 y.o. male with medical history significant of GERD, diverticulosis, bladder cancer and leukemia type 2 diabetes, hypertension presenting with sepsis, right heel wound, encephalopathy.  Limited history in the setting of sepsis and encephalopathy.  History from patient as well as wife.  Per report, patient with noted right heel wound.   Sepsis (HCC) --Meeting sepsis criteria with HR 100s, RR 20s,  --WBC 4.9  --Lactate 1.9  --Sepsis resolved -- MRI foot no evidence of osteomyelitis. -- Podiatry consultation with Dr. Michalene Agee. Recommends dressing changes and keep pressure of the foot. No indication for IV antibiotics or any podiatry surgery. -- Change to PO Keflex                Non-healing open wound of right heel/pressure sore given patient bedbound --Worsening R heel wound  --as above   acute encephalopathy-- now resolved --Positive generalized lethargy and confusion on presentation --Suspect multifactorial contributions to sepsis --Suspect underlying baseline dementia given age and comorbidities   Elevated troponin --Trop 40s-50s on presentation  --EKG stable  -Suspect minimal to mild demand ischemia in setting of sepsis    AKI (acute kidney injury) (HCC) chronic kidney disease stageIIIB Bladder cancer (HCC)-- chronic Foley catheter (changed on 10/08/2023--per wife) --Baseline history of bladder cancer --Creatinine 2 on presentation -- baseline creatinine 1.4 (2021) --CT abdomen pelvis Small layering bladder stones dependently near the left UVJ. No ureteral stones or hydronephrosis.  Cholelithiasis.  No CT evidence of acute cholecystitis.  Aortoiliac atherosclerosis. --received IV fluid hydration    HTN (hypertension) --holding valsartan with low bp and elevated  creat  Bedbound status protein calorie malnutrition -- patient gets services from [facility].  History of leukemia --cont gleevac  Procedures: Family communication : patient son and wife at bedside Consults : podiatry CODE STATUS: DNR DVT Prophylaxis :SCD (due to bleeding from heel ulcer) Status is: Inpatient Remains inpatient appropriate because: overall improving. At baseline. If remains stable will discharge tomorrow home. Patient's son and wife agreeable    TOTAL TIME TAKING CARE OF THIS PATIENT: 50 minutes.  >50% time spent on counselling and coordination of care  Note: This dictation was prepared with Dragon dictation along with smaller phrase technology. Any transcriptional errors that result from this process are unintentional.  Melvinia Stager M.D    [facility]   CC: Primary care physician; Deveron Fly, MD (Inactive)

## 2023-10-10 NOTE — Plan of Care (Signed)
   Problem: Education: Goal: Knowledge of General Education information will improve Description: Including pain rating scale, medication(s)/side effects and non-pharmacologic comfort measures Outcome: Progressing   Problem: Coping: Goal: Level of anxiety will decrease Outcome: Progressing

## 2023-10-10 NOTE — Consult Note (Addendum)
 Reason for Consult: Right foot ulcers Referring Physician: Dr. Magdalene School Nicolas Mayo is an 83 y.o. male.  HPI: He states he is not sure how long the wound has been there.  Has had drainage at home from them.  The heel has been bleeding.  Past Medical History:  Diagnosis Date   Blood dyscrasia    Cancer (HCC)    leukemia   Colon polyp    Diabetes mellitus without complication (HCC)    Diverticulosis    Diverticulosis    Duodenitis    Duodenitis    Duodenitis    Esophagitis    Gastropathy    Gastropathy    GERD (gastroesophageal reflux disease)    Schatzki's ring 08/18/2014   Schatzki's ring    Tubulovillous adenoma polyp of colon     Past Surgical History:  Procedure Laterality Date   APPENDECTOMY     BACK SURGERY     COLON SURGERY     COLONOSCOPY     COLONOSCOPY WITH PROPOFOL N/A 04/20/2016   Procedure: COLONOSCOPY WITH PROPOFOL;  Surgeon: Deveron Fly, MD;  Location: [facility] ENDOSCOPY;  Service: Endoscopy;  Laterality: N/A;   disectomy     ESOPHAGOGASTRODUODENOSCOPY     ESOPHAGOGASTRODUODENOSCOPY N/A 01/04/2015   Procedure: ESOPHAGOGASTRODUODENOSCOPY (EGD);  Surgeon: Deveron Fly, MD;  Location: [facility] ENDOSCOPY;  Service: Endoscopy;  Laterality: N/A;   ESOPHAGOGASTRODUODENOSCOPY (EGD) WITH PROPOFOL N/A 01/11/2016   Procedure: ESOPHAGOGASTRODUODENOSCOPY (EGD) WITH PROPOFOL;  Surgeon: Deveron Fly, MD;  Location: [facility] ENDOSCOPY;  Service: Endoscopy;  Laterality: N/A;   ESOPHAGOGASTRODUODENOSCOPY (EGD) WITH PROPOFOL N/A 01/02/2017   Procedure: ESOPHAGOGASTRODUODENOSCOPY (EGD) WITH PROPOFOL;  Surgeon: Deveron Fly, MD;  Location: [facility] ENDOSCOPY;  Service: Endoscopy;  Laterality: N/A;   ESOPHAGOGASTRODUODENOSCOPY (EGD) WITH PROPOFOL N/A 07/30/2017   Procedure: ESOPHAGOGASTRODUODENOSCOPY (EGD) WITH PROPOFOL;  Surgeon: Deveron Fly, MD;  Location: [facility] ENDOSCOPY;  Service: Endoscopy;  Laterality: N/A;   partial amputation of finger     pilonidial  cyst surgery     resection of colon polyps     TONSILLECTOMY      Family History  Problem Relation Age of Onset   Stomach cancer Mother    Colon cancer Father    Ulcers Father     Social History:  reports that he quit smoking about 5 years ago. His smoking use included cigarettes. He started smoking about 65 years ago. He has a 60 pack-year smoking history. He has never used smokeless tobacco. He reports current alcohol use of about 14.0 standard drinks of alcohol per week. He reports that he does not use drugs.  Allergies: No Known Allergies  Medications: I have reviewed the patient's current medications.  Results for orders placed or performed during the hospital encounter of 10/09/23 (from the past 48 hours)  Comprehensive metabolic panel     Status: Abnormal   Collection Time: 10/09/23  1:25 PM  Result Value Ref Range   Sodium 135 135 - 145 mmol/L   Potassium 3.9 3.5 - 5.1 mmol/L   Chloride 94 (L) 98 - 111 mmol/L   CO2 31 22 - 32 mmol/L   Glucose, Bld 124 (H) 70 - 99 mg/dL    Comment: Glucose reference range applies only to samples taken after fasting for at least 8 hours.   BUN 26 (H) 8 - 23 mg/dL   Creatinine, Ser 0.45 (H) 0.61 - 1.24 mg/dL   Calcium 8.0 (L) 8.9 - 10.3 mg/dL   Total Protein 5.1 (L) 6.5 -  8.1 g/dL   Albumin 2.4 (L) 3.5 - 5.0 g/dL   AST 28 15 - 41 U/L   ALT 12 0 - 44 U/L   Alkaline Phosphatase 60 38 - 126 U/L   Total Bilirubin 0.6 0.0 - 1.2 mg/dL   GFR, Estimated 33 (L) >60 mL/min    Comment: (NOTE) Calculated using the CKD-EPI Creatinine Equation (2021)    Anion gap 10 5 - 15    Comment: Performed at [facility], [address] [place], [place] [address]  CBC with Differential     Status: Abnormal   Collection Time: 10/09/23  1:25 PM  Result Value Ref Range   WBC 4.9 4.0 - 10.5 K/uL   RBC 2.76 (L) 4.22 - 5.81 MIL/uL   Hemoglobin 9.2 (L) 13.0 - 17.0 g/dL   HCT 47.8 (L) 29.5 - 62.1 %   MCV 97.5 80.0 - 100.0 fL   MCH 33.3 26.0 - 34.0  pg   MCHC 34.2 30.0 - 36.0 g/dL   RDW 30.8 (H) 65.7 - 84.6 %   Platelets 210 150 - 400 K/uL   nRBC 0.0 0.0 - 0.2 %   Neutrophils Relative % 67 %   Neutro Abs 3.2 1.7 - 7.7 K/uL   Lymphocytes Relative 16 %   Lymphs Abs 0.8 0.7 - 4.0 K/uL   Monocytes Relative 12 %   Monocytes Absolute 0.6 0.1 - 1.0 K/uL   Eosinophils Relative 4 %   Eosinophils Absolute 0.2 0.0 - 0.5 K/uL   Basophils Relative 1 %   Basophils Absolute 0.1 0.0 - 0.1 K/uL   Immature Granulocytes 0 %   Abs Immature Granulocytes 0.01 0.00 - 0.07 K/uL    Comment: Performed at [facility], [address] [place], [place] [address]  Protime-INR     Status: None   Collection Time: 10/09/23  1:25 PM  Result Value Ref Range   Prothrombin Time 14.0 11.4 - 15.2 seconds   INR 1.1 0.8 - 1.2    Comment: (NOTE) INR goal varies based on device and disease states. Performed at [facility], [address] [place], [place] [address]   Blood Culture (routine x 2)     Status: None (Preliminary result)   Collection Time: 10/09/23  1:25 PM   Specimen: BLOOD  Result Value Ref Range   Specimen Description BLOOD RIGHT ANTECUBITAL    Special Requests      BOTTLES DRAWN AEROBIC AND ANAEROBIC Blood Culture adequate volume   Culture      NO GROWTH < 24 HOURS Performed at [facility], [address], [place], [place] [address]    Report Status PENDING   Troponin I (High Sensitivity)     Status: Abnormal   Collection Time: 10/09/23  1:25 PM  Result Value Ref Range   Troponin I (High Sensitivity) 50 (H) <18 ng/L    Comment: (NOTE) Elevated high sensitivity troponin I (hsTnI) values and significant  changes across serial measurements may suggest ACS but many other  chronic and acute conditions are known to elevate hsTnI results.  Refer to the "Links" section for chest pain algorithms and additional  guidance. Performed at [facility], [address] [place], [place] [address]   Resp panel  by RT-PCR (RSV, Flu A&B, Covid) Anterior Nasal Swab     Status: None   Collection Time: 10/09/23  1:27 PM   Specimen: Anterior Nasal Swab  Result Value Ref Range   SARS Coronavirus 2 by  RT PCR NEGATIVE NEGATIVE    Comment: (NOTE) SARS-CoV-2 target nucleic acids are NOT DETECTED.  The SARS-CoV-2 RNA is generally detectable in upper respiratory specimens during the acute phase of infection. The lowest concentration of SARS-CoV-2 viral copies this assay can detect is 138 copies/mL. A negative result does not preclude SARS-Cov-2 infection and should not be used as the sole basis for treatment or other patient management decisions. A negative result may occur with  improper specimen collection/handling, submission of specimen other than nasopharyngeal swab, presence of viral mutation(s) within the areas targeted by this assay, and inadequate number of viral copies(<138 copies/mL). A negative result must be combined with clinical observations, patient history, and epidemiological information. The expected result is Negative.  Fact Sheet for Patients:  BloggerCourse.com  Fact Sheet for Healthcare Providers:  SeriousBroker.it  This test is no t yet approved or cleared by the [place]  FDA and  has been authorized for detection and/or diagnosis of SARS-CoV-2 by FDA under an Emergency Use Authorization (EUA). This EUA will remain  in effect (meaning this test can be used) for the duration of the COVID-19 declaration under Section 564(b)(1) of the Act, 21 U.S.C.section 360bbb-3(b)(1), unless the authorization is terminated  or revoked sooner.       Influenza A by PCR NEGATIVE NEGATIVE   Influenza B by PCR NEGATIVE NEGATIVE    Comment: (NOTE) The Xpert Xpress SARS-CoV-2/FLU/RSV plus assay is intended as an aid in the diagnosis of influenza from Nasopharyngeal swab specimens and should not be used as a sole basis for treatment. Nasal  washings and aspirates are unacceptable for Xpert Xpress SARS-CoV-2/FLU/RSV testing.  Fact Sheet for Patients: BloggerCourse.com  Fact Sheet for Healthcare Providers: SeriousBroker.it  This test is not yet approved or cleared by the [place]  FDA and has been authorized for detection and/or diagnosis of SARS-CoV-2 by FDA under an Emergency Use Authorization (EUA). This EUA will remain in effect (meaning this test can be used) for the duration of the COVID-19 declaration under Section 564(b)(1) of the Act, 21 U.S.C. section 360bbb-3(b)(1), unless the authorization is terminated or revoked.     Resp Syncytial Virus by PCR NEGATIVE NEGATIVE    Comment: (NOTE) Fact Sheet for Patients: BloggerCourse.com  Fact Sheet for Healthcare Providers: SeriousBroker.it  This test is not yet approved or cleared by the [place]  FDA and has been authorized for detection and/or diagnosis of SARS-CoV-2 by FDA under an Emergency Use Authorization (EUA). This EUA will remain in effect (meaning this test can be used) for the duration of the COVID-19 declaration under Section 564(b)(1) of the Act, 21 U.S.C. section 360bbb-3(b)(1), unless the authorization is terminated or revoked.  Performed at [facility], [address] [place], [place] [address]   Lactic acid, plasma     Status: None   Collection Time: 10/09/23  1:27 PM  Result Value Ref Range   Lactic Acid, Venous 1.9 0.5 - 1.9 mmol/L    Comment: Performed at [facility], [address] [place], [place] [address]  Troponin I (High Sensitivity)     Status: Abnormal   Collection Time: 10/09/23  3:58 PM  Result Value Ref Range   Troponin I (High Sensitivity) 45 (H) <18 ng/L    Comment: (NOTE) Elevated high sensitivity troponin I (hsTnI) values and significant  changes across serial measurements may suggest ACS but  many other  chronic and acute conditions are known to elevate hsTnI results.  Refer to the "Links" section for chest pain  algorithms and additional  guidance. Performed at [facility], [address] [place], [place] [address]   Blood Culture (routine x 2)     Status: None (Preliminary result)   Collection Time: 10/09/23  5:07 PM   Specimen: BLOOD  Result Value Ref Range   Specimen Description BLOOD BLOOD RIGHT HAND    Special Requests      BOTTLES DRAWN AEROBIC AND ANAEROBIC Blood Culture results may not be optimal due to an inadequate volume of blood received in culture bottles   Culture      NO GROWTH < 12 HOURS Performed at [facility], [address] [place], [place] [address]    Report Status PENDING   MRSA Next Gen by PCR, Nasal     Status: None   Collection Time: 10/09/23  5:40 PM   Specimen: Nasal Mucosa; Nasal Swab  Result Value Ref Range   MRSA by PCR Next Gen NOT DETECTED NOT DETECTED    Comment: (NOTE) The GeneXpert MRSA Assay (FDA approved for NASAL specimens only), is one component of a comprehensive MRSA colonization surveillance program. It is not intended to diagnose MRSA infection nor to guide or monitor treatment for MRSA infections. Test performance is not FDA approved in patients less than 37 years old. Performed at [facility], [address] [place], [place] [address]   APTT     Status: Abnormal   Collection Time: 10/09/23  6:04 PM  Result Value Ref Range   aPTT 41 (H) 24 - 36 seconds    Comment:        IF BASELINE aPTT IS ELEVATED, SUGGEST PATIENT RISK ASSESSMENT BE USED TO DETERMINE APPROPRIATE ANTICOAGULANT THERAPY. Performed at [facility], [address] [place], [place] [address]   CBC     Status: Abnormal   Collection Time: 10/10/23  4:23 AM  Result Value Ref Range   WBC 5.3 4.0 - 10.5 K/uL   RBC 2.41 (L) 4.22 - 5.81 MIL/uL   Hemoglobin 8.0 (L) 13.0 - 17.0 g/dL   HCT 29.5 (L) 62.1 - 30.8 %    MCV 93.8 80.0 - 100.0 fL   MCH 33.2 26.0 - 34.0 pg   MCHC 35.4 30.0 - 36.0 g/dL   RDW 65.7 (H) 84.6 - 96.2 %   Platelets 177 150 - 400 K/uL   nRBC 0.0 0.0 - 0.2 %    Comment: Performed at [facility], [address] [place], [place] [address]  Comprehensive metabolic panel     Status: Abnormal   Collection Time: 10/10/23  4:23 AM  Result Value Ref Range   Sodium 135 135 - 145 mmol/L   Potassium 3.5 3.5 - 5.1 mmol/L   Chloride 99 98 - 111 mmol/L   CO2 28 22 - 32 mmol/L   Glucose, Bld 81 70 - 99 mg/dL    Comment: Glucose reference range applies only to samples taken after fasting for at least 8 hours.   BUN 25 (H) 8 - 23 mg/dL   Creatinine, Ser 1.32 (H) 0.61 - 1.24 mg/dL   Calcium 7.3 (L) 8.9 - 10.3 mg/dL   Total Protein 4.3 (L) 6.5 - 8.1 g/dL   Albumin 1.9 (L) 3.5 - 5.0 g/dL   AST 31 15 - 41 U/L   ALT 11 0 - 44 U/L   Alkaline Phosphatase 52 38 - 126 U/L   Total Bilirubin 0.9 0.0 - 1.2 mg/dL   GFR, Estimated 35 (L) >60 mL/min  Comment: (NOTE) Calculated using the CKD-EPI Creatinine Equation (2021)    Anion gap 8 5 - 15    Comment: Performed at [facility], [address] [place], [place] [address]    US ARTERIAL ABI (SCREENING LOWER EXTREMITY) Result Date: 10/10/2023 CLINICAL DATA:  Hypertension. Right vascular ulcer. Diabetes. Former tobacco user. EXAM: NONINVASIVE PHYSIOLOGIC VASCULAR STUDY OF BILATERAL LOWER EXTREMITIES TECHNIQUE: Evaluation of both lower extremities were performed at rest, including calculation of ankle-brachial indices with single level pressure measurements and doppler recording. COMPARISON:  None available. FINDINGS: Right ABI:  1.13 Left ABI:  2.01 Right Lower Extremity:  Normal arterial waveforms at the ankle. Left Lower Extremity:  Normal arterial waveforms at the ankle. 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 Normal right ankle-brachial  indices. 2. Nondiagnostic evaluation of the left lower extremity due to incompressible calcified vessels. Electronically Signed   By: Acquanetta Belling M.D.   On: 10/10/2023 12:02   MR HEEL RIGHT W WO CONTRAST Result Date: 10/10/2023 CLINICAL DATA:  Soft tissue infection. Leukemia. Diabetes. Weakness. Swelling. EXAM: MR OF THE RIGHT HEEL WITHOUT AND WITH CONTRAST TECHNIQUE: Multiplanar, multisequence MR imaging of the ankle was performed before and after the administration of intravenous contrast. CONTRAST:  6mL GADAVIST GADOBUTROL 1 MMOL/ML IV SOLN COMPARISON:  Radiographs 10/09/2023 FINDINGS: TENDONS Peroneal: Mild common peroneus tendon sheath tenosynovitis. Posteromedial: Distal tibialis posterior tenosynovitis. Mild expansion of the distal tibialis posterior tendon favoring tendinopathy. There is also flexor digitorum longus and flexor hallucis longus tenosynovitis. Anterior: Unremarkable Achilles: Minimal distal Achilles tendinopathy. Plantar Fascia: Mild thickening of the medial band of plantar fascia with mildly accentuated T2 signal within the fascia proximally on image 10 series 10 compatible with plantar fasciitis. Low-level edema in the underlying subcutaneous tissues. LIGAMENTS Lateral: Unremarkable Medial: Unremarkable CARTILAGE Ankle Joint: Unremarkable Subtalar Joints/Sinus Tarsi: Mild edema in the sinus tarsi although less than is typically seen in the setting of sinus tarsi syndrome. Small effusion of the posterior subtalar joint medially as on images 12-13 of series 10. Bones: Mild dorsal spurring at the talonavicular articulation. No marrow edema or enhancement to suggest osteomyelitis. Other: Wound along the posterior heel with some subcutaneous thinning in this vicinity as well as overlying bandaging. Subcutaneous edema and enhancement along the posteroinferior heel favor cellulitis. No drainable abscess or underlying osteomyelitis. IMPRESSION: IMPRESSION 1. Wound along the posterior heel with  some subcutaneous thinning in this vicinity as well as overlying bandaging. Subcutaneous edema and enhancement along the posteroinferior heel favor cellulitis. No drainable abscess or underlying osteomyelitis. 2. Mild plantar fasciitis. 3. Mild common peroneus tendon sheath tenosynovitis. 4. Distal tibialis posterior tenosynovitis with mild expansion of the distal tibialis posterior tendon favoring tendinopathy. There is also flexor digitorum longus and flexor hallucis longus tenosynovitis. 5. Minimal distal Achilles tendinopathy. 6. Mild edema in the sinus tarsi although less than is typically seen in the setting of sinus tarsi syndrome. 7. Small effusion of the posterior subtalar joint medially. No enhancing margins to suggest septic joint. Electronically Signed   By: Gaylyn Rong M.D.   On: 10/10/2023 08:47   CT ABDOMEN PELVIS WO CONTRAST Result Date: 10/09/2023 CLINICAL DATA:  Sepsis EXAM: CT ABDOMEN AND PELVIS WITHOUT CONTRAST TECHNIQUE: Multidetector CT imaging of the abdomen and pelvis was performed following the standard protocol without IV contrast. RADIATION DOSE REDUCTION: This exam was performed according to the departmental dose-optimization program which includes automated exposure control, adjustment of the mA and/or kV according  to patient size and/or use of iterative reconstruction technique. COMPARISON:  09/16/2014 FINDINGS: Lower chest: Clustered nodular densities peripherally in the left lower lobe. Probable scarring in the lingula. Right lung base clear. No effusions. Hepatobiliary: No focal hepatic abnormality. Small layering gallstones. No CT evidence of acute cholecystitis. No biliary ductal dilatation. Pancreas: No focal abnormality or ductal dilatation. Spleen: No focal abnormality.  Normal size. Adrenals/Urinary Tract: No renal or adrenal abnormality. No renal or ureteral stones or hydronephrosis. Small stones noted layering posteriorly in the bladder near the left UVJ. Air within  the urinary bladder presumably from recent catheterization. Stomach/Bowel: Stomach, large and small bowel grossly unremarkable. Postoperative changes in the right colon and transverse colon. No bowel obstruction or inflammatory process. Vascular/Lymphatic: Diffuse aortoiliac atherosclerosis. No evidence of aneurysm or adenopathy. Reproductive: No visible focal abnormality. Other: No free fluid or free air. Musculoskeletal: No acute bony abnormality. IMPRESSION: Small layering bladder stones dependently near the left UVJ. No ureteral stones or hydronephrosis. Cholelithiasis.  No CT evidence of acute cholecystitis. Aortoiliac atherosclerosis. Clustered tree-in-bud nodular densities in the left lower lobe, likely small airways disease. Electronically Signed   By: Janeece Mechanic M.D.   On: 10/09/2023 19:01   DG Chest Port 1 View Result Date: 10/09/2023 CLINICAL DATA:  Questionable sepsis.  Evaluate for abnormality. EXAM: PORTABLE CHEST 1 VIEW COMPARISON:  Radiographs 01/08/2008. FINDINGS: 1348 hours. Two views submitted. The heart size and mediastinal contours are normal. There is a small left pleural effusion with mild left basilar atelectasis. No confluent airspace disease or pneumothorax. The right lung appears clear. There are degenerative changes in the spine without evidence of acute osseous abnormality. IMPRESSION: Small left pleural effusion with mild left basilar atelectasis. No confluent airspace disease. Electronically Signed   By: Elmon Hagedorn M.D.   On: 10/09/2023 16:39   DG Foot Complete Right Result Date: 10/09/2023 CLINICAL DATA:  Right ankle skin ulcer. EXAM: RIGHT FOOT COMPLETE - 3+ VIEW COMPARISON:  None Available. FINDINGS: Soft tissue ulceration at the heel. No evidence of acute osteolysis or erosive changes. No acute fracture or dislocation. Mild first MTP and diffuse interphalangeal joint space narrowing. Mild degenerative changes of the midfoot. IMPRESSION: Soft tissue swelling and  ulceration at the heel. No evidence of acute osteomyelitis. Electronically Signed   By: Mannie Seek M.D.   On: 10/09/2023 16:32    Review of Systems  Constitutional:  Positive for malaise/fatigue. Negative for chills and fever.  Respiratory:  Negative for shortness of breath.   Cardiovascular:  Negative for chest pain.  Gastrointestinal:  Negative for nausea and vomiting.  All other systems reviewed and are negative.  Blood pressure (!) 148/99, pulse 82, temperature 97.7 F (36.5 C), resp. rate 18, height 6' (1.829 m), weight 68 kg, SpO2 98%.  Vitals:   10/10/23 0417 10/10/23 0818  BP: (!) 91/47 (!) 148/99  Pulse: 96 82  Resp: 18 18  Temp: 97.7 F (36.5 C) 97.7 F (36.5 C)  SpO2: 98%     General AA&O x3. Normal mood and affect.  Vascular Dorsalis pedis and posterior tibial pulses  present 1+ right  Capillary refill normal to all digits. Pedal hair growth diminished.  Neurologic Epicritic sensation grossly absent.  Dermatologic (Wound) Right foot lateral ulceration partial-thickness with eschar and hemorrhagic bulla lateral fifth metatarsal head and base and plantar heel partial-thickness ulceration with oozing.  No active signs of infection no purulence or malodor.  No cellulitis.  Orthopedic: Motor intact BLE.    Assessment/Plan:  Right  foot ulcerations -Imaging: Studies independently reviewed.  No evidence of osteomyelitis clinically does not have any cellulitis.  Circulation is sufficient. -Do not think he needs significant broad-spectrum antibiotics.  7 days of a cephalosporin is reasonable. -WOC consult can be placed for wound care dressing recommendations -If concern for continued bleeding hold antiplatelet and anticoagulation meds -Weightbearing as tolerated -Should be referred to the [facility] wound care center following discharge - We will not follow.  If infection develops please let us  know  Floyce Hutching 10/10/2023, 1:19 PM   Best available via secure chat  for questions or concerns.

## 2023-10-10 NOTE — Progress Notes (Addendum)
 Spoke with wife on the phone regarding indwelling catheter. Wife stated that [facility] nurse replaced April 14th.Aaron Aas

## 2023-10-10 NOTE — Care Management Obs Status (Signed)
 MEDICARE OBSERVATION STATUS NOTIFICATION   Patient Details  Name: Nicolas Mayo MRN: [id number] Date of Birth: [birth year]   Medicare Observation Status Notification Given:  Rudolph Cost, CMA 10/10/2023, 2:03 PM

## 2023-10-10 NOTE — Progress Notes (Signed)
 Dr. Vallarie Gauze, on call, made aware of decreased UOP.  Pt with history of bladder cancer and has chronic foley which was replaced prior to arrival to ER by home health RN.  Irrigated with 60ml sterile water x 2 and total of 60ml amber urine returned.  Bladder scan done which showed about .  CT scan done yesterday which showed some bladder stones, but no obstruction.  Pt denies any discomfort at this time.  Per Dr. Vallarie Gauze, will continue LR at 150 and continue to monitor UOP and watch for any signs of fluid overload. Sheela Denmark BSN RN CMSRN 10/10/2023, 1:31 AM

## 2023-10-11 DIAGNOSIS — C679 Malignant neoplasm of bladder, unspecified: Secondary | ICD-10-CM | POA: Diagnosis not present

## 2023-10-11 DIAGNOSIS — N179 Acute kidney failure, unspecified: Secondary | ICD-10-CM | POA: Diagnosis not present

## 2023-10-11 DIAGNOSIS — S91301A Unspecified open wound, right foot, initial encounter: Secondary | ICD-10-CM | POA: Diagnosis not present

## 2023-10-11 MED ORDER — CEPHALEXIN 500 MG PO CAPS: 500.0000 mg | ORAL_CAPSULE | Freq: Three times a day (TID) | ORAL | 0 refills | Status: DC

## 2023-10-11 MED ORDER — FUROSEMIDE 20 MG PO TABS: 20.0000 mg | ORAL_TABLET | ORAL | 0 refills | Status: DC

## 2023-10-11 MED ORDER — MIDODRINE HCL 5 MG PO TABS: 5.0000 mg | ORAL_TABLET | Freq: Three times a day (TID) | ORAL | 1 refills | Status: DC

## 2023-10-11 MED ORDER — FUROSEMIDE 20 MG PO TABS: 20.0000 mg | ORAL_TABLET | Freq: Every day | ORAL | 0 refills | Status: AC | PRN

## 2023-10-11 MED ORDER — ENSURE ENLIVE PO LIQD: 237.0000 mL | Freq: Two times a day (BID) | ORAL | 12 refills | Status: AC

## 2023-10-11 NOTE — Plan of Care (Signed)
 PMT consult noted for goals of care.  Per notes patient is discharging at this time.  PMT will sign off.  Please reconsult if needs arise.

## 2023-10-11 NOTE — Consult Note (Signed)
 WOC Nurse Consult Note: patient has been evaluated by podiatry with recommendations for wound care and referral to wound care center at discharge for R foot wounds   Reason for Consult: wound care recommendations wounds R foot  Wound type: full thickness R heel and R lateral foot  Pressure Injury POA: NA, not known if these are from pressure or trauma Measurement: see nursing flowsheet  Wound bed: R heel 75% pink 25% scattered black eschar; R lateral foot 3 separate areas 100% black eschar  Drainage (amount, consistency, odor) bloody appearing to R heel, R lateral foot dry  Periwound: dry scaly skin  Dressing procedure/placement/frequency: Cleanse R foot wounds with soap and water, dry and apply Xeroform gauze ([place] [phone]) to wound beds daily, cover with dry gauze and secure with Kerlix roll gauze.  Apply Ace bandage over Kerlix for light compression.  Place R foot in Prevalon boot to offload pressure [facility] [phone]). Patient should wear Prevalon boot only when in bed, do not attempt to stand with Prevalon boot on.   POC discussed with bedside nurse and primary MD. WOC team will not follow. Patient should follow with wound care center at discharge.   Thank you,    Ronni Colace MSN, RN-BC, [employer] [phone]

## 2023-10-11 NOTE — Plan of Care (Signed)
  Problem: Education: Goal: Knowledge of General Education information will improve Description: Including pain rating scale, medication(s)/side effects and non-pharmacologic comfort measures Outcome: Adequate for Discharge   Problem: Health Behavior/Discharge Planning: Goal: Ability to manage health-related needs will improve Outcome: Adequate for Discharge   Problem: Clinical Measurements: Goal: Ability to maintain clinical measurements within normal limits will improve Outcome: Adequate for Discharge Goal: Will remain free from infection Outcome: Adequate for Discharge Goal: Diagnostic test results will improve Outcome: Adequate for Discharge Goal: Respiratory complications will improve Outcome: Adequate for Discharge Goal: Cardiovascular complication will be avoided Outcome: Adequate for Discharge   Problem: Activity: Goal: Risk for activity intolerance will decrease Outcome: Adequate for Discharge   Problem: Nutrition: Goal: Adequate nutrition will be maintained Outcome: Adequate for Discharge   Problem: Coping: Goal: Level of anxiety will decrease Outcome: Adequate for Discharge   Problem: Elimination: Goal: Will not experience complications related to bowel motility Outcome: Adequate for Discharge Goal: Will not experience complications related to urinary retention Outcome: Adequate for Discharge   Problem: Pain Managment: Goal: General experience of comfort will improve and/or be controlled Outcome: Adequate for Discharge   Problem: Safety: Goal: Ability to remain free from injury will improve Outcome: Adequate for Discharge   Problem: Skin Integrity: Goal: Risk for impaired skin integrity will decrease Outcome: Adequate for Discharge   Problem: Fluid Volume: Goal: Hemodynamic stability will improve Outcome: Adequate for Discharge   Problem: Clinical Measurements: Goal: Diagnostic test results will improve Outcome: Adequate for Discharge Goal: Signs  and symptoms of infection will decrease Outcome: Adequate for Discharge   Problem: Respiratory: Goal: Ability to maintain adequate ventilation will improve Outcome: Adequate for Discharge

## 2023-10-11 NOTE — TOC Transition Note (Signed)
 Transition of Care [facility]) - Discharge Note   Patient Details  Name: Neev Mcmains MRN: [id number] Date of Birth: [birth year]  Transition of [facility]) CM/SW Contact:  Alexandra Ice, RN Phone Number: 10/11/2023, 12:42 PM   Clinical Narrative:     Patient to discharge home today, resume home health services. [facility] and to resume [facility] health services.   Final next level of care: Home w Home Health Services Barriers to Discharge: Barriers Resolved   Patient Goals and CMS Choice   CMS Medicare.gov Compare Post Acute Care list provided to:: Patient        Discharge Placement                       Discharge Plan and Services Additional resources added to the After Visit Summary for                            [facility] Arranged: RN (resumption of care via [place])       Representative spoke with at [facility] Agency: [facility] to [facility] services.  Social Drivers of Health (SDOH) Interventions SDOH Screenings   Food Insecurity: No Food Insecurity (10/09/2023)  Housing: Low Risk  (10/09/2023)  Transportation Needs: No Transportation Needs (10/09/2023)  Utilities: Not At Risk (10/09/2023)  Depression (PHQ2-9): Low Risk  (04/21/2019)  Social Connections: Socially Isolated (10/09/2023)  Tobacco Use: Medium Risk (10/09/2023)     Readmission Risk Interventions     No data to display

## 2023-10-11 NOTE — Discharge Summary (Addendum)
 Physician Discharge Summary   Patient: Nicolas Mayo MRN: [id number] DOB: 1941/04/02  Admit date:     10/09/2023  Discharge date: 10/11/23  Discharge Physician: Enedina Finner   PC[facility]: [facility]   Recommendations at discharge:    F[facility] 1-2 weeks home health RN to help with dressing changes on right heel use prevalon boot both lower extremity to avoid pressure sores  Discharge Diagnoses: Principal Problem:   Sepsis (HCC) Active Problems:   Non-healing open wound of right heel   Elevated troponin   Encephalopathy   AKI (acute kidney injury) (HCC)   HTN (hypertension)   Bladder cancer (HCC)   Ulcer of right foot, limited to breakdown of skin (HCC)   Nicolas Mayo is a 83 y.o. male with medical history significant of GERD, diverticulosis, bladder cancer and leukemia type 2 diabetes, hypertension presenting with sepsis, right heel wound, encephalopathy.  Limited history in the setting of sepsis and encephalopathy.  History from patient as well as wife.  Per report, patient with noted right heel wound.    Sepsis (HCC) --Meeting sepsis criteria with HR 100s, RR 20s,  --WBC 4.9  --Lactate 1.9  --Sepsis resolved -- MRI foot no evidence of osteomyelitis. -- Podiatry consultation with Dr. Lilian Kapur. Recommends dressing changes and keep pressure of the foot. No indication for IV antibiotics or any podiatry surgery. -- Change to PO Keflex --WOC input noted for dressing instructions                Non-healing open wound of right heel/pressure sore given patient bedbound --Worsening R heel wound  --as above   acute encephalopathy-- now resolved --Positive generalized lethargy and confusion on presentation --Suspect multifactorial contributions to sepsis --Suspect underlying baseline dementia given age and comorbidities --overall poor prognosis   Elevated troponin --Trop 40s-50s on presentation  --EKG stable  -Suspect minimal to mild demand  ischemia in setting of sepsis    AKI (acute kidney injury) (HCC) chronic kidney disease stageIIIB Bladder cancer (HCC)-- chronic Foley catheter (changed on 10/08/2023--per wife) --Baseline history of bladder cancer --Creatinine 2 on presentation -- baseline creatinine 1.4 (2021) --CT abdomen pelvis Small layering bladder stones dependently near the left UVJ. No ureteral stones or hydronephrosis. Cholelithiasis.  No CT evidence of acute cholecystitis. Aortoiliac atherosclerosis. --received IV fluid hydration  --HOLD Lasix for now and defer to PCP. Take only as needed for leg swelling   HTN (hypertension) --holding valsartan with low bp and elevated creat --pt will d/w PCP BP reading at home before resuming meds --will give trial of midodrine at home. --pt encouraged to increase po intake   Bedbound status protein calorie malnutrition -- patient gets services from [facility].   History of leukemia --cont gleevac  D/c home with resumption of HH services   Procedures: Family communication : none today Consults : podiatry CODE STATUS: DNR DVT Prophylaxis :SCD (due to bleeding from heel ulcer)    Pain control - [place] Controlled Substance Reporting System database was reviewed. and patient was instructed, not to drive, operate heavy machinery, perform activities at heights, swimming or participation in water activities or provide baby-sitting services while on Pain, Sleep and Anxiety Medications; until their outpatient Physician has advised to do so again. Also recommended to not to take more than prescribed Pain, Sleep and Anxiety Medications.  Consultants: Podiatry Procedures performed: none  Disposition: Home Diet recommendation:  Discharge Diet Orders (From admission, onward)     Start  Ordered   10/11/23 0000  Diet - low sodium heart healthy        10/11/23 1126           Cardiac diet DISCHARGE MEDICATION: Allergies as of 10/11/2023   No Known Allergies       Medication List     PAUSE taking these medications    valsartan 40 MG tablet Wait to take this until your doctor or other care provider tells you to start again. Hold till SBP >120 Discuss with PCP BP readings from home and follow further instructions Commonly known as: DIOVAN Take 40 mg by mouth daily.       TAKE these medications    albuterol (5 MG/ML) 0.5% Nebu Commonly known as: VENTOLIN Inhale into the lungs.   cephALEXin 500 MG capsule Commonly known as: KEFLEX Take 1 capsule (500 mg total) by mouth 3 (three) times daily for 6 days.   cholecalciferol 1000 units tablet Commonly known as: VITAMIN D Take 1,000 Units by mouth daily. Vit. D 3   cyanocobalamin 1000 MCG tablet Commonly known as: VITAMIN B12 Take 1,000 mcg by mouth daily.   DULoxetine 20 MG capsule Commonly known as: CYMBALTA Take 20 mg by mouth 2 (two) times daily.   feeding supplement Liqd Take 237 mLs by mouth 2 (two) times daily between meals.   ferrous sulfate 325 (65 FE) MG tablet Take 325 mg by mouth 3 (three) times a week. Take 1 tablet (325 mg) by mouth every Monday, Wednesday, and Friday   finasteride 5 MG tablet Commonly known as: PROSCAR Take 5 mg by mouth daily.   furosemide 20 MG tablet Commonly known as: LASIX Take 1 tablet (20 mg total) by mouth daily as needed. Take 1 tablet (20mg ) by mouth prn for leg swelling What changed:  when to take this reasons to take this additional instructions   imatinib 100 MG tablet Commonly known as: GLEEVEC Take 300 mg by mouth daily. Reported on 01/10/2016   magnesium oxide 400 (240 Mg) MG tablet Commonly known as: MAG-OX Take 400 mg by mouth daily.   midodrine 5 MG tablet Commonly known as: PROAMATINE Take 1 tablet (5 mg total) by mouth 3 (three) times daily with meals.   pantoprazole 40 MG tablet Commonly known as: PROTONIX Take 40 mg by mouth daily. Reported on 01/11/2016   pregabalin 75 MG capsule Commonly known as:  LYRICA Take 75 mg by mouth 2 (two) times daily.   PRESERVISION AREDS 2 PO Take 2 capsules by mouth daily.   tamsulosin 0.4 MG Caps capsule Commonly known as: FLOMAX Take 0.4 mg by mouth daily.   UNABLE TO FIND Take by mouth.               Discharge Care Instructions  (From admission, onward)           Start     Ordered   10/11/23 0000  Discharge wound care:       Comments: Pressure Injury 10/09/23 Heel Right Deep Tissue Pressure Injury - Purple or maroon localized area of discolored intact skin or blood-filled blister due to damage of underlying soft tissue from pressure and/or shear. bleeding, purple/red, with skin slough 1 day      Wound Care Orders (From admission, onward)      Start     Ordered   10/11/23 1200    Wound care  Daily      Comments: Cleanse R foot wounds with soap and water, dry and  apply Xeroform gauze [place] [phone]) to wound beds daily, cover with dry gauze and secure with Kerlix roll gauze.  Apply Ace bandage over Kerlix for light compression.  Place R foot in Prevalon boot to offload pressure [place] [phone]). Patient should wear Prevalon boot only when in bed, do not attempt to stand with Prevalon boot on  10/11/23 1108   10/11/23 1126            Follow-up Information     [facility]. Schedule an appointment as soon as possible for a visit in 1 week(s).   Specialty: General Practice Why: hospital f/u;  [facility] (left [facility]M) Contact information: [address] [place] [address] [phone]                Discharge Exam: Nicolas Mayo Weights   10/09/23 1301  Weight: 68 kg   Expand All Collapse All  [facility]  - [place] [facility]     PATIENT NAME: Nicolas Mayo     MR#:  [id number]   DATE OF BIRTH:  05-Jul-1940   SUBJECTIVE:  wife and son at bedside. Patient with bleeding from right heel ulcer. He is bedbound for couple years. No fever. Was evaluated by podiatry.        VITALS:  Blood pressure (!) 148/99, pulse 82, temperature 97.7 F (36.5 C), resp. rate 18, height 6' (1.829 m), weight 68 kg, SpO2 98%.   PHYSICAL EXAMINATION:    GENERAL:  83 y.o.-year-old patient with no acute distress. Appears chronically ill frail  LUNGS: Normal breath sounds bilaterally, no wheezing CARDIOVASCULAR: S1, S2 normal. No murmur   ABDOMEN: Soft, nontender, nondistended. Bowel sounds present.  EXTREMITIES:right heel  NEUROLOGIC: nonfocal  patient is alert and awake SKINas as above      Condition at discharge: fair  The results of significant diagnostics from this hospitalization (including imaging, microbiology, ancillary and laboratory) are listed below for reference.   Imaging Studies: US ARTERIAL ABI (SCREENING LOWER EXTREMITY) Result Date: 10/10/2023 CLINICAL DATA:  Hypertension. Right vascular ulcer. Diabetes. Former tobacco user. EXAM: NONINVASIVE PHYSIOLOGIC VASCULAR STUDY OF BILATERAL LOWER EXTREMITIES TECHNIQUE: Evaluation of both lower extremities were performed at rest, including calculation of ankle-brachial indices with single level pressure measurements and doppler recording. COMPARISON:  None available. FINDINGS: Right ABI:  1.13 Left ABI:  2.01 Right Lower Extremity:  Normal arterial waveforms at the ankle. Left Lower Extremity:  Normal arterial waveforms at the ankle. 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 Normal right ankle-brachial indices. 2. Nondiagnostic evaluation of the left lower extremity due to incompressible calcified vessels. Electronically Signed   By: Acquanetta Belling M.D.   On: 10/10/2023 12:02   MR HEEL RIGHT W WO CONTRAST Result Date: 10/10/2023 CLINICAL DATA:  Soft tissue infection. Leukemia. Diabetes. Weakness. Swelling. EXAM: MR OF THE RIGHT HEEL WITHOUT AND WITH CONTRAST TECHNIQUE: Multiplanar, multisequence MR imaging of the ankle was performed before  and after the administration of intravenous contrast. CONTRAST:  6mL GADAVIST GADOBUTROL 1 MMOL/ML IV SOLN COMPARISON:  Radiographs 10/09/2023 FINDINGS: TENDONS Peroneal: Mild common peroneus tendon sheath tenosynovitis. Posteromedial: Distal tibialis posterior tenosynovitis. Mild expansion of the distal tibialis posterior tendon favoring tendinopathy. There is also flexor digitorum longus and flexor hallucis longus tenosynovitis. Anterior: Unremarkable Achilles: Minimal distal Achilles tendinopathy. Plantar Fascia: Mild thickening of the medial band of plantar fascia with mildly accentuated T2 signal within the fascia proximally on image  10 series 10 compatible with plantar fasciitis. Low-level edema in the underlying subcutaneous tissues. LIGAMENTS Lateral: Unremarkable Medial: Unremarkable CARTILAGE Ankle Joint: Unremarkable Subtalar Joints/Sinus Tarsi: Mild edema in the sinus tarsi although less than is typically seen in the setting of sinus tarsi syndrome. Small effusion of the posterior subtalar joint medially as on images 12-13 of series 10. Bones: Mild dorsal spurring at the talonavicular articulation. No marrow edema or enhancement to suggest osteomyelitis. Other: Wound along the posterior heel with some subcutaneous thinning in this vicinity as well as overlying bandaging. Subcutaneous edema and enhancement along the posteroinferior heel favor cellulitis. No drainable abscess or underlying osteomyelitis. IMPRESSION: IMPRESSION 1. Wound along the posterior heel with some subcutaneous thinning in this vicinity as well as overlying bandaging. Subcutaneous edema and enhancement along the posteroinferior heel favor cellulitis. No drainable abscess or underlying osteomyelitis. 2. Mild plantar fasciitis. 3. Mild common peroneus tendon sheath tenosynovitis. 4. Distal tibialis posterior tenosynovitis with mild expansion of the distal tibialis posterior tendon favoring tendinopathy. There is also flexor digitorum  longus and flexor hallucis longus tenosynovitis. 5. Minimal distal Achilles tendinopathy. 6. Mild edema in the sinus tarsi although less than is typically seen in the setting of sinus tarsi syndrome. 7. Small effusion of the posterior subtalar joint medially. No enhancing margins to suggest septic joint. Electronically Signed   By: Gaylyn Rong M.D.   On: 10/10/2023 08:47   CT ABDOMEN PELVIS WO CONTRAST Result Date: 10/09/2023 CLINICAL DATA:  Sepsis EXAM: CT ABDOMEN AND PELVIS WITHOUT CONTRAST TECHNIQUE: Multidetector CT imaging of the abdomen and pelvis was performed following the standard protocol without IV contrast. RADIATION DOSE REDUCTION: This exam was performed according to the departmental dose-optimization program which includes automated exposure control, adjustment of the mA and/or kV according to patient size and/or use of iterative reconstruction technique. COMPARISON:  09/16/2014 FINDINGS: Lower chest: Clustered nodular densities peripherally in the left lower lobe. Probable scarring in the lingula. Right lung base clear. No effusions. Hepatobiliary: No focal hepatic abnormality. Small layering gallstones. No CT evidence of acute cholecystitis. No biliary ductal dilatation. Pancreas: No focal abnormality or ductal dilatation. Spleen: No focal abnormality.  Normal size. Adrenals/Urinary Tract: No renal or adrenal abnormality. No renal or ureteral stones or hydronephrosis. Small stones noted layering posteriorly in the bladder near the left UVJ. Air within the urinary bladder presumably from recent catheterization. Stomach/Bowel: Stomach, large and small bowel grossly unremarkable. Postoperative changes in the right colon and transverse colon. No bowel obstruction or inflammatory process. Vascular/Lymphatic: Diffuse aortoiliac atherosclerosis. No evidence of aneurysm or adenopathy. Reproductive: No visible focal abnormality. Other: No free fluid or free air. Musculoskeletal: No acute bony  abnormality. IMPRESSION: Small layering bladder stones dependently near the left UVJ. No ureteral stones or hydronephrosis. Cholelithiasis.  No CT evidence of acute cholecystitis. Aortoiliac atherosclerosis. Clustered tree-in-bud nodular densities in the left lower lobe, likely small airways disease. Electronically Signed   By: Charlett Nose M.D.   On: 10/09/2023 19:01   DG Chest Port 1 View Result Date: 10/09/2023 CLINICAL DATA:  Questionable sepsis.  Evaluate for abnormality. EXAM: PORTABLE CHEST 1 VIEW COMPARISON:  Radiographs 01/08/2008. FINDINGS: 1348 hours. Two views submitted. The heart size and mediastinal contours are normal. There is a small left pleural effusion with mild left basilar atelectasis. No confluent airspace disease or pneumothorax. The right lung appears clear. There are degenerative changes in the spine without evidence of acute osseous abnormality. IMPRESSION: Small left pleural effusion with mild left basilar atelectasis. No confluent airspace  disease. Electronically Signed   By: Elmon Hagedorn M.D.   On: 10/09/2023 16:39   DG Foot Complete Right Result Date: 10/09/2023 CLINICAL DATA:  Right ankle skin ulcer. EXAM: RIGHT FOOT COMPLETE - 3+ VIEW COMPARISON:  None Available. FINDINGS: Soft tissue ulceration at the heel. No evidence of acute osteolysis or erosive changes. No acute fracture or dislocation. Mild first MTP and diffuse interphalangeal joint space narrowing. Mild degenerative changes of the midfoot. IMPRESSION: Soft tissue swelling and ulceration at the heel. No evidence of acute osteomyelitis. Electronically Signed   By: Mannie Seek M.D.   On: 10/09/2023 16:32    Microbiology: Results for orders placed or performed during the hospital encounter of 10/09/23  Blood Culture (routine x 2)     Status: None (Preliminary result)   Collection Time: 10/09/23  1:25 PM   Specimen: BLOOD  Result Value Ref Range Status   Specimen Description BLOOD RIGHT ANTECUBITAL  Final    Special Requests   Final    BOTTLES DRAWN AEROBIC AND ANAEROBIC Blood Culture adequate volume   Culture   Final    NO GROWTH 2 DAYS Performed at [facility], [address] [place], [place] [address]    Report Status PENDING  Incomplete  Resp panel by RT-PCR (RSV, Flu A&B, Covid) Anterior Nasal Swab     Status: None   Collection Time: 10/09/23  1:27 PM   Specimen: Anterior Nasal Swab  Result Value Ref Range Status   SARS Coronavirus 2 by RT PCR NEGATIVE NEGATIVE Final    Comment: (NOTE) SARS-CoV-2 target nucleic acids are NOT DETECTED.  The SARS-CoV-2 RNA is generally detectable in upper respiratory specimens during the acute phase of infection. The lowest concentration of SARS-CoV-2 viral copies this assay can detect is 138 copies/mL. A negative result does not preclude SARS-Cov-2 infection and should not be used as the sole basis for treatment or other patient management decisions. A negative result may occur with  improper specimen collection/handling, submission of specimen other than nasopharyngeal swab, presence of viral mutation(s) within the areas targeted by this assay, and inadequate number of viral copies(<138 copies/mL). A negative result must be combined with clinical observations, patient history, and epidemiological information. The expected result is Negative.  Fact Sheet for Patients:  BloggerCourse.com  Fact Sheet for Healthcare Providers:  SeriousBroker.it  This test is no t yet approved or cleared by the [place]  FDA and  has been authorized for detection and/or diagnosis of SARS-CoV-2 by FDA under an Emergency Use Authorization (EUA). This EUA will remain  in effect (meaning this test can be used) for the duration of the COVID-19 declaration under Section 564(b)(1) of the Act, 21 U.S.C.section 360bbb-3(b)(1), unless the authorization is terminated  or revoked sooner.       Influenza A  by PCR NEGATIVE NEGATIVE Final   Influenza B by PCR NEGATIVE NEGATIVE Final    Comment: (NOTE) The Xpert Xpress SARS-CoV-2/FLU/RSV plus assay is intended as an aid in the diagnosis of influenza from Nasopharyngeal swab specimens and should not be used as a sole basis for treatment. Nasal washings and aspirates are unacceptable for Xpert Xpress SARS-CoV-2/FLU/RSV testing.  Fact Sheet for Patients: BloggerCourse.com  Fact Sheet for Healthcare Providers: SeriousBroker.it  This test is not yet approved or cleared by the [place]  FDA and has been authorized for detection and/or diagnosis of SARS-CoV-2 by FDA under an Emergency Use Authorization (EUA). This EUA will remain in effect (meaning this test can be used) for  the duration of the COVID-19 declaration under Section 564(b)(1) of the Act, 21 U.S.C. section 360bbb-3(b)(1), unless the authorization is terminated or revoked.     Resp Syncytial Virus by PCR NEGATIVE NEGATIVE Final    Comment: (NOTE) Fact Sheet for Patients: BloggerCourse.com  Fact Sheet for Healthcare Providers: SeriousBroker.it  This test is not yet approved or cleared by the [place]  FDA and has been authorized for detection and/or diagnosis of SARS-CoV-2 by FDA under an Emergency Use Authorization (EUA). This EUA will remain in effect (meaning this test can be used) for the duration of the COVID-19 declaration under Section 564(b)(1) of the Act, 21 U.S.C. section 360bbb-3(b)(1), unless the authorization is terminated or revoked.  Performed at [facility], [address] [place], [place] [address]   Blood Culture (routine x 2)     Status: None (Preliminary result)   Collection Time: 10/09/23  5:07 PM   Specimen: BLOOD  Result Value Ref Range Status   Specimen Description BLOOD BLOOD RIGHT HAND  Final   Special Requests   Final     BOTTLES DRAWN AEROBIC AND ANAEROBIC Blood Culture results may not be optimal due to an inadequate volume of blood received in culture bottles   Culture   Final    NO GROWTH 2 DAYS Performed at [facility], [address], [place], [place] [address]    Report Status PENDING  Incomplete  MRSA Next Gen by PCR, Nasal     Status: None   Collection Time: 10/09/23  5:40 PM   Specimen: Nasal Mucosa; Nasal Swab  Result Value Ref Range Status   MRSA by PCR Next Gen NOT DETECTED NOT DETECTED Final    Comment: (NOTE) The GeneXpert MRSA Assay (FDA approved for NASAL specimens only), is one component of a comprehensive MRSA colonization surveillance program. It is not intended to diagnose MRSA infection nor to guide or monitor treatment for MRSA infections. Test performance is not FDA approved in patients less than 69 years old. Performed at [facility], [address] [place], [place] [address]     Labs: CBC: Recent Labs  Lab 10/09/23 1325 10/10/23 0423  WBC 4.9 5.3  NEUTROABS 3.2  --   HGB 9.2* 8.0*  HCT 26.9* 22.6*  MCV 97.5 93.8  PLT 210 177   Basic Metabolic Panel: Recent Labs  Lab 10/09/23 1325 10/10/23 0423  NA 135 135  K 3.9 3.5  CL 94* 99  CO2 31 28  GLUCOSE 124* 81  BUN 26* 25*  CREATININE 2.02* 1.90*  CALCIUM 8.0* 7.3*   Liver Function Tests: Recent Labs  Lab 10/09/23 1325 10/10/23 0423  AST 28 31  ALT 12 11  ALKPHOS 60 52  BILITOT 0.6 0.9  PROT 5.1* 4.3*  ALBUMIN 2.4* 1.9*    Discharge time spent: greater than 30 minutes.  Signed: Melvinia Stager, MD [facility] 10/11/2023

## 2023-10-11 NOTE — Plan of Care (Signed)
  Problem: Education: Goal: Knowledge of General Education information will improve Description: Including pain rating scale, medication(s)/side effects and non-pharmacologic comfort measures 10/11/2023 0517 by Jenita Miyamoto, LPN Outcome: Progressing 10/11/2023 0517 by Jenita Miyamoto, LPN Outcome: Progressing   Problem: Health Behavior/Discharge Planning: Goal: Ability to manage health-related needs will improve Outcome: Progressing   Problem: Clinical Measurements: Goal: Ability to maintain clinical measurements within normal limits will improve Outcome: Progressing Goal: Will remain free from infection Outcome: Progressing Goal: Diagnostic test results will improve Outcome: Progressing Goal: Respiratory complications will improve Outcome: Progressing Goal: Cardiovascular complication will be avoided Outcome: Progressing   Problem: Coping: Goal: Level of anxiety will decrease Outcome: Progressing   Problem: Elimination: Goal: Will not experience complications related to bowel motility Outcome: Progressing   Problem: Pain Managment: Goal: General experience of comfort will improve and/or be controlled Outcome: Progressing   Problem: Safety: Goal: Ability to remain free from injury will improve Outcome: Progressing   Problem: Skin Integrity: Goal: Risk for impaired skin integrity will decrease Outcome: Progressing   Problem: Fluid Volume: Goal: Hemodynamic stability will improve Outcome: Progressing   Problem: Clinical Measurements: Goal: Diagnostic test results will improve Outcome: Progressing Goal: Signs and symptoms of infection will decrease Outcome: Progressing   Problem: Respiratory: Goal: Ability to maintain adequate ventilation will improve Outcome: Progressing   Problem: Health Behavior/Discharge Planning: Goal: Ability to manage health-related needs will improve Outcome: Progressing

## 2023-10-11 NOTE — Progress Notes (Signed)
 Patient ready for discharge, bp 90/47 (58), patient very pale, some blood noted in urine, wound bleed as well, MD aware.

## 2023-10-12 DIAGNOSIS — C679 Malignant neoplasm of bladder, unspecified: Secondary | ICD-10-CM | POA: Diagnosis not present

## 2023-10-12 DIAGNOSIS — S91301A Unspecified open wound, right foot, initial encounter: Secondary | ICD-10-CM | POA: Diagnosis not present

## 2023-10-12 DIAGNOSIS — N179 Acute kidney failure, unspecified: Secondary | ICD-10-CM | POA: Diagnosis not present

## 2023-10-12 DIAGNOSIS — Z7189 Other specified counseling: Secondary | ICD-10-CM | POA: Diagnosis not present

## 2023-10-12 MED ORDER — CEPHALEXIN 500 MG PO CAPS: 500.0000 mg | ORAL_CAPSULE | Freq: Three times a day (TID) | ORAL | 0 refills | Status: AC

## 2023-10-12 MED ORDER — MIDODRINE HCL 5 MG PO TABS: 5.0000 mg | ORAL_TABLET | Freq: Three times a day (TID) | ORAL | Status: DC

## 2023-10-12 MED ORDER — MIDODRINE HCL 5 MG PO TABS: 5.0000 mg | ORAL_TABLET | Freq: Three times a day (TID) | ORAL | 1 refills | Status: AC

## 2023-10-12 MED ORDER — MIDODRINE HCL 5 MG PO TABS
5.0000 mg | ORAL_TABLET | Freq: Three times a day (TID) | ORAL | Status: DC
  Administered 2023-10-12: 5 mg via ORAL
  Filled 2023-10-12: qty 1

## 2023-10-12 NOTE — TOC Transition Note (Signed)
 Transition of Care [facility]) - Discharge Note   Patient Details  Name: Nicolas Mayo MRN: [id number] Date of Birth: May 13, 1941  Transition of Care [facility]) CM/SW Contact:  Alexandra Ice, RN Phone Number: 10/12/2023, 10:19 AM   Clinical Narrative:     Patient discharging home with hospice. He needs EMS transport home. Spoke with Myrtie Atkinson with [facility], transport set up, #3 on list. EMS packet printed to nurse station. MD and bedside nurse notified.   Final next level of care: Home w Home Health Services Barriers to Discharge: Barriers Resolved   Patient Goals and CMS Choice   CMS Medicare.gov Compare Post Acute Care list provided to:: Patient        Discharge Placement                       Discharge Plan and Services Additional resources added to the After Visit Summary for                            [facility] Arranged: RN (resumption of care via [place])       Representative spoke with at [facility]: [facility]  Social Drivers of Health (SDOH) Interventions SDOH Screenings   Food Insecurity: No Food Insecurity (10/09/2023)  Housing: Low Risk  (10/09/2023)  Transportation Needs: No Transportation Needs (10/09/2023)  Utilities: Not At Risk (10/09/2023)  Depression (PHQ2-9): Low Risk  (04/21/2019)  Social Connections: Socially Isolated (10/09/2023)  Tobacco Use: Medium Risk (10/09/2023)     Readmission Risk Interventions     No data to display

## 2023-10-12 NOTE — Progress Notes (Signed)
 MEWS Progress Note  Patient Details Name: Nicolas Mayo MRN: [id number] DOB: [birth year] Today's Date: 10/12/2023   MEWS Flowsheet Documentation:  Assess: MEWS Score Temp: 98.2 F (36.8 C) BP: (!) 92/42 (doctor was in the room and I notified the nurse.) MAP (mmHg): (!) 58 Pulse Rate: (!) 45 ECG Heart Rate: (!) 104 Resp: 18 Level of Consciousness: Alert SpO2: 98 % O2 Device: Room Air Assess: MEWS Score MEWS Temp: 0 MEWS Systolic: 1 MEWS Pulse: 1 MEWS RR: 0 MEWS LOC: 0 MEWS Score: 2 MEWS Score Color: Yellow Assess: SIRS CRITERIA SIRS Temperature : 0 SIRS Respirations : 0 SIRS Pulse: 0 SIRS WBC: 0 SIRS Score Sum : 0    Provider made aware. Midodrine  ordered for low BP.       [name] 10/12/2023, 8:49 AM

## 2023-10-12 NOTE — Consult Note (Signed)
 Consultation Note Date: 10/12/2023   Patient Name: Nicolas Mayo  DOB: 1941-03-19  MRN: [id number]  Age / Sex: 83 y.o., male  PCP: [facility] Referring Physician: No att. providers found  Reason for Consultation: Establishing goals of care  HPI/Patient Profile: PER NOTES: "Yasir Reichmann is a 83 y.o. male with medical history significant of GERD, diverticulosis, bladder cancer and leukemia type 2 diabetes, hypertension presenting with sepsis, right heel wound, encephalopathy.  Limited history in the setting of sepsis and encephalopathy.  History from patient as well as wife.  Per report, patient with noted right heel wound".   Clinical Assessment and Goals of Care: Notes and labs reviewed.  In to see patient.  He is currently resting in bed at this time.  He states he lives at home with his wife.  He states he has 3 children.  We discussed his diagnosis, prognosis, GOC, EOL wishes disposition and options.  Created space and opportunity for patient  to explore thoughts and feelings regarding current medical information.   A detailed discussion was had today regarding advanced directives.  Concepts specific to code status, artifical feeding and hydration, IV antibiotics and rehospitalization were discussed.  The difference between an aggressive medical intervention path and a comfort care path was discussed.  Values and goals of care important to patient and family were attempted to be elicited.  Discussed limitations of medical interventions to prolong quality of life in some situations and discussed the concept of human mortality.  He states he is tired and he has been ready to go home.  Clarifying, he states he has prayed that God would take him home.  He would like to go home and focus on comfort care until death.    SUMMARY OF RECOMMENDATIONS   Case discussed with attending.   Attending was able to speak with patient's wife, and plans in place to go home with hospice to follow.  Prognosis:  Poor      Primary Diagnoses: Present on Admission:  Sepsis (HCC)   I have reviewed the medical record, interviewed the patient and family, and examined the patient. The following aspects are pertinent.  Past Medical History:  Diagnosis Date   Blood dyscrasia    Cancer (HCC)    leukemia   Colon polyp    Diabetes mellitus without complication (HCC)    Diverticulosis    Diverticulosis    Duodenitis    Duodenitis    Duodenitis    Esophagitis    Gastropathy    Gastropathy    GERD (gastroesophageal reflux disease)    Schatzki's ring 08/18/2014   Schatzki's ring    Tubulovillous adenoma polyp of colon    Social History   Socioeconomic History   Marital status: Married    Spouse name: Not on file   Number of children: Not on file   Years of education: Not on file   Highest education level: Not on file  Occupational History   Not on file  Tobacco Use  Smoking status: Former    Current packs/day: 0.00    Average packs/day: 1 pack/day for 60.0 years (60.0 ttl pk-yrs)    Types: Cigarettes    Start date: 12/05/1957    Quit date: 12/05/2017    Years since quitting: 5.8   Smokeless tobacco: Never   Tobacco comments:    patient states he is done smoking.  Vaping Use   Vaping status: Never Used  Substance and Sexual Activity   Alcohol use: Yes    Alcohol/week: 14.0 standard drinks of alcohol    Types: 14 Shots of liquor per week    Comment: rum   Drug use: No   Sexual activity: Not on file  Other Topics Concern   Not on file  Social History Narrative   Not on file   Social Drivers of Health   Financial Resource Strain: Not on file  Food Insecurity: No Food Insecurity (10/09/2023)   Hunger Vital Sign    Worried About Running Out of Food in the Last Year: Never true    Ran Out of Food in the Last Year: Never true  Transportation Needs: No  Transportation Needs (10/09/2023)   PRAPARE - Administrator, Civil Service (Medical): No    Lack of Transportation (Non-Medical): No  Physical Activity: Not on file  Stress: Not on file  Social Connections: Socially Isolated (10/09/2023)   Social Connection and Isolation Panel [NHANES]    Frequency of Communication with Friends and Family: Never    Frequency of Social Gatherings with Friends and Family: Never    Attends Religious Services: Never    Database administrator or Organizations: No    Attends Engineer, structural: Never    Marital Status: Married   Family History  Problem Relation Age of Onset   Stomach cancer Mother    Colon cancer Father    Ulcers Father    Scheduled Meds:  cephALEXin   500 mg Oral TID   Chlorhexidine  Gluconate Cloth  6 each Topical Q0600   cholecalciferol   1,000 Units Oral Daily   cyanocobalamin   1,000 mcg Oral Daily   DULoxetine   20 mg Oral BID   feeding supplement  237 mL Oral BID BM   ferrous sulfate   325 mg Oral Once per day on Monday Wednesday Friday   finasteride   5 mg Oral Daily   imatinib   300 mg Oral Daily   magnesium  oxide  400 mg Oral Daily   midodrine   5 mg Oral TID WC   multivitamin  1 tablet Oral Daily   pantoprazole   40 mg Oral Daily   pregabalin   75 mg Oral BID   tamsulosin   0.4 mg Oral Daily   Continuous Infusions: PRN Meds:.acetaminophen , ondansetron  **OR** ondansetron  (ZOFRAN ) IV Medications Prior to Admission:  Prior to Admission medications   Medication Sig Start Date End Date Taking? Authorizing Provider  cholecalciferol  (VITAMIN D ) 1000 units tablet Take 1,000 Units by mouth daily. Vit. D 3   Yes [provider]  DULoxetine  (CYMBALTA ) 20 MG capsule Take 20 mg by mouth 2 (two) times daily.   Yes [provider]  ferrous sulfate  325 (65 FE) MG tablet Take 325 mg by mouth 3 (three) times a week. Take 1 tablet (325 mg) by mouth every Monday, Wednesday, and Friday   Yes [provider]  finasteride  (PROSCAR ) 5 MG tablet Take 5 mg by mouth daily. 05/15/23  Yes [provider]  imatinib  (GLEEVEC ) 100 MG tablet Take 300  mg by mouth daily. Reported on 01/10/2016   Yes [provider]  magnesium  oxide (MAG-OX) 400 (240 Mg) MG tablet Take 400 mg by mouth daily.   Yes [provider]  Multiple Vitamins-Minerals (PRESERVISION AREDS 2 PO) Take 2 capsules by mouth daily.   Yes [provider]  pantoprazole  (PROTONIX ) 40 MG tablet Take 40 mg by mouth daily. Reported on 01/11/2016   Yes [provider]  pregabalin  (LYRICA ) 75 MG capsule Take 75 mg by mouth 2 (two) times daily. 06/29/23  Yes [provider]  tamsulosin  (FLOMAX ) 0.4 MG CAPS capsule Take 0.4 mg by mouth daily. 05/15/23  Yes [provider]  albuterol  (PROVENTIL , VENTOLIN ) (5 MG/ML) 0.5% NEBU Inhale into the lungs.    [provider]  cephALEXin  (KEFLEX ) 500 MG capsule Take 1 capsule (500 mg total) by mouth 3 (three) times daily for 6 days. 10/12/23 10/18/23  Patel, Sona, MD  feeding supplement (ENSURE ENLIVE / ENSURE PLUS) LIQD Take 237 mLs by mouth 2 (two) times daily between meals. 10/11/23   Patel, Sona, MD  furosemide  (LASIX ) 20 MG tablet Take 1 tablet (20 mg total) by mouth daily as needed. Take 1 tablet (20mg ) by mouth prn for leg swelling 10/11/23   Patel, Sona, MD  midodrine  (PROAMATINE ) 5 MG tablet Take 1 tablet (5 mg total) by mouth 3 (three) times daily with meals. 10/12/23   Patel, Sona, MD  UNABLE TO FIND Take by mouth.    [provider]  valsartan (DIOVAN) 40 MG tablet Take 40 mg by mouth daily.     [provider]  vitamin B-12 (CYANOCOBALAMIN ) 1000 MCG tablet Take 1,000 mcg by mouth daily.    [provider]   No Known Allergies Review of Systems  Constitutional:        States he does not feel well overall.    Physical Exam HENT:     Head: Normocephalic.  Pulmonary:     Effort: Pulmonary effort is  normal.  Musculoskeletal:     Comments: Right foot wrapped with bandage.  Skin:    General: Skin is warm and dry.     Vital Signs: BP (!) 106/51 (BP Location: Left Arm)   Pulse (!) 116   Temp 98.1 F (36.7 C) (Oral)   Resp 18   Ht 6' (1.829 m)   Wt 68 kg   SpO2 96%   BMI 20.34 kg/m  Pain Scale: 0-10 POSS *See Group Information*: S-Acceptable,Sleep, easy to arouse Pain Score: 0-No pain   SpO2: SpO2: 96 % O2 Device:SpO2: 96 % O2 Flow Rate: .   IO: Intake/output summary:  Intake/Output Summary (Last 24 hours) at 10/12/2023 1129 Last data filed at 10/12/2023 0950 Gross per 24 hour  Intake --  Output 200 ml  Net -200 ml    LBM: Last BM Date : 10/08/23 Baseline Weight: Weight: 68 kg Most recent weight: Weight: 68 kg      Signed by: Meribeth Standard, NP   Please contact Palliative Medicine Team phone at [phone] for questions and concerns.  For individual provider: See Tilford Foley

## 2023-10-12 NOTE — Plan of Care (Signed)
  Problem: Education: Goal: Knowledge of General Education information will improve Description: Including pain rating scale, medication(s)/side effects and non-pharmacologic comfort measures Outcome: Adequate for Discharge   Problem: Health Behavior/Discharge Planning: Goal: Ability to manage health-related needs will improve Outcome: Adequate for Discharge   Problem: Clinical Measurements: Goal: Ability to maintain clinical measurements within normal limits will improve Outcome: Adequate for Discharge Goal: Will remain free from infection Outcome: Adequate for Discharge Goal: Diagnostic test results will improve Outcome: Adequate for Discharge Goal: Respiratory complications will improve Outcome: Adequate for Discharge Goal: Cardiovascular complication will be avoided Outcome: Adequate for Discharge   Problem: Activity: Goal: Risk for activity intolerance will decrease Outcome: Adequate for Discharge   Problem: Nutrition: Goal: Adequate nutrition will be maintained Outcome: Adequate for Discharge   Problem: Coping: Goal: Level of anxiety will decrease Outcome: Adequate for Discharge   Problem: Elimination: Goal: Will not experience complications related to bowel motility Outcome: Adequate for Discharge Goal: Will not experience complications related to urinary retention Outcome: Adequate for Discharge   Problem: Pain Managment: Goal: General experience of comfort will improve and/or be controlled Outcome: Adequate for Discharge   Problem: Safety: Goal: Ability to remain free from injury will improve Outcome: Adequate for Discharge   Problem: Skin Integrity: Goal: Risk for impaired skin integrity will decrease Outcome: Adequate for Discharge   Problem: Fluid Volume: Goal: Hemodynamic stability will improve Outcome: Adequate for Discharge   Problem: Clinical Measurements: Goal: Diagnostic test results will improve Outcome: Adequate for Discharge Goal: Signs  and symptoms of infection will decrease Outcome: Adequate for Discharge   Problem: Respiratory: Goal: Ability to maintain adequate ventilation will improve Outcome: Adequate for Discharge

## 2023-10-12 NOTE — Discharge Summary (Signed)
 Physician Discharge Summary   Patient: Nicolas Mayo MRN: [id number] DOB: 06-Jul-1940  Admit date:     10/09/2023  Discharge date: 10/12/23  Discharge Physician: Melvinia Stager   [facility]   Recommendations at discharge:    [facility] [place] [place] [facility]-2 [facility] health RN to help with dressing changes on right heel use prevalon boot both lower extremity to avoid pressure sores  Discharge Diagnoses: Principal Problem:   Sepsis (HCC) Active Problems:   Non-healing open wound of right heel   Elevated troponin   Encephalopathy   AKI (acute kidney injury) (HCC)   HTN (hypertension)   Bladder cancer (HCC)   Ulcer of right foot, limited to breakdown of skin (HCC)   Nicolas Mayo is a 83 y.o. male with medical history significant of GERD, diverticulosis, bladder cancer and leukemia type 2 diabetes, hypertension presenting with sepsis, right heel wound, encephalopathy.  Limited history in the setting of sepsis and encephalopathy.  History from patient as well as wife.  Per report, patient with noted right heel wound.    Sepsis (HCC) --Meeting sepsis criteria with HR 100s, RR 20s,  --WBC 4.9  --Lactate 1.9  --Sepsis resolved -- MRI foot no evidence of osteomyelitis. -- Podiatry consultation with Dr. Michalene Agee. Recommends dressing changes and keep pressure of the foot. No indication for IV antibiotics or any podiatry surgery. -- Change to PO Keflex  --WOC input noted for dressing instructions                Non-healing open wound of right heel/pressure sore given patient bedbound --Worsening R heel wound  --as above   acute encephalopathy-- now resolved --Positive generalized lethargy and confusion on presentation --Suspect multifactorial contributions to sepsis --Suspect underlying baseline dementia given age and comorbidities --overall poor prognosis. Pt has been set up by Hospice thru the [place]. Per wife they will come eval 10/17/23. Wife understands poor  prognosis and pt's desire to be home and not wanting anything aggressive be done. --Palliative care input appreciated.   Elevated troponin --Trop 40s-50s on presentation  --EKG stable  -Suspect minimal to mild demand ischemia in setting of sepsis    AKI (acute kidney injury) (HCC) chronic kidney disease stageIIIB Bladder cancer (HCC)-- chronic Foley catheter (changed on 10/08/2023--per wife) --Baseline history of bladder cancer --Creatinine 2 on presentation -- baseline creatinine 1.4 (2021) --CT abdomen pelvis Small layering bladder stones dependently near the left UVJ. No ureteral stones or hydronephrosis. Cholelithiasis.  No CT evidence of acute cholecystitis. Aortoiliac atherosclerosis. --received IV fluid hydration  --HOLD Lasix  for now and defer to PCP. Take only as needed for leg swelling   HTN (hypertension) --holding valsartan with low bp and elevated creat --pt will d/w PCP BP reading at home before resuming meds --will give trial of midodrine  at home. --pt encouraged to increase po intake   Bedbound status protein calorie malnutrition -- patient gets services from [place] [facility]--including set up with Hospice   History of leukemia --cont gleevac  D/c home with resumption of HH/hospice services thru [place], [place]    Family communication : none today Consults : podiatry CODE STATUS: DNR DVT Prophylaxis :SCD (due to bleeding from heel ulcer)    Pain control - Nicolas Mayo  Controlled Substance Reporting System database was reviewed. and patient was instructed, not to drive, operate heavy machinery, perform activities at heights, swimming or participation in water activities or provide baby-sitting services while on Pain, Sleep and Anxiety Medications; until their outpatient Physician has  advised to do so again. Also recommended to not to take more than prescribed Pain, Sleep and Anxiety Medications.  Consultants: Podiatry Diet recommendation:  Discharge Diet Orders (From  admission, onward)     Start     Ordered   10/11/23 0000  Diet - low sodium heart healthy        10/11/23 1126           Cardiac diet DISCHARGE MEDICATION: Allergies as of 10/12/2023   No Known Allergies      Medication List     PAUSE taking these medications    valsartan 40 MG tablet Wait to take this until your doctor or other care provider tells you to start again. Hold till SBP >120 Discuss with PCP BP readings from home and follow further instructions Commonly known as: DIOVAN Take 40 mg by mouth daily.       TAKE these medications    albuterol  (5 MG/ML) 0.5% Nebu Commonly known as: VENTOLIN  Inhale into the lungs.   cephALEXin  500 MG capsule Commonly known as: KEFLEX  Take 1 capsule (500 mg total) by mouth 3 (three) times daily for 6 days.   cholecalciferol  1000 units tablet Commonly known as: VITAMIN D  Take 1,000 Units by mouth daily. Vit. D 3   cyanocobalamin  1000 MCG tablet Commonly known as: VITAMIN B12 Take 1,000 mcg by mouth daily.   DULoxetine  20 MG capsule Commonly known as: CYMBALTA  Take 20 mg by mouth 2 (two) times daily.   feeding supplement Liqd Take 237 mLs by mouth 2 (two) times daily between meals.   ferrous sulfate  325 (65 FE) MG tablet Take 325 mg by mouth 3 (three) times a week. Take 1 tablet (325 mg) by mouth every Monday, Wednesday, and Friday   finasteride  5 MG tablet Commonly known as: PROSCAR  Take 5 mg by mouth daily.   furosemide  20 MG tablet Commonly known as: LASIX  Take 1 tablet (20 mg total) by mouth daily as needed. Take 1 tablet (20mg ) by mouth prn for leg swelling What changed:  when to take this reasons to take this additional instructions   imatinib  100 MG tablet Commonly known as: GLEEVEC  Take 300 mg by mouth daily. Reported on 01/10/2016   magnesium  oxide 400 (240 Mg) MG tablet Commonly known as: MAG-OX Take 400 mg by mouth daily.   midodrine  5 MG tablet Commonly known as: PROAMATINE  Take 1 tablet  (5 mg total) by mouth 3 (three) times daily with meals.   pantoprazole  40 MG tablet Commonly known as: PROTONIX  Take 40 mg by mouth daily. Reported on 01/11/2016   pregabalin  75 MG capsule Commonly known as: LYRICA  Take 75 mg by mouth 2 (two) times daily.   PRESERVISION AREDS 2 PO Take 2 capsules by mouth daily.   tamsulosin  0.4 MG Caps capsule Commonly known as: FLOMAX  Take 0.4 mg by mouth daily.   UNABLE TO FIND Take by mouth.               Discharge Care Instructions  (From admission, onward)           Start     Ordered   10/11/23 0000  Discharge wound care:       Comments: Pressure Injury 10/09/23 Heel Right Deep Tissue Pressure Injury - Purple or maroon localized area of discolored intact skin or blood-filled blister due to damage of underlying soft tissue from pressure and/or shear. bleeding, purple/red, with skin slough 1 day      Wound Care Orders (From  admission, onward)      Start     Ordered   10/11/23 1200    Wound care  Daily      Comments: Cleanse R foot wounds with soap and water, dry and apply Xeroform gauze Timm Foot [phone]) to wound beds daily, cover with dry gauze and secure with Kerlix roll gauze.  Apply Ace bandage over Kerlix for light compression.  Place R foot in Prevalon boot to offload pressure Timm Foot [phone]). Patient should wear Prevalon boot only when in bed, do not attempt to stand with Prevalon boot on  10/11/23 1108   10/11/23 1126            Follow-up Information     [facility]. Schedule an appointment as soon as possible for a visit in 1 week(s).   Specialty: General Practice Why: hospital f/u;  [facility]12[facility] at [facility]. Contact information: [address] [place] [address] [phone]                Discharge Exam: Nicolas Mayo Weights   10/09/23 1301  Weight: 68 kg    Discharge time spent: greater than 30 minutes.  Signed: Melvinia Stager, MD [facility] 10/12/2023

## 2023-10-14 LAB — CULTURE, BLOOD (ROUTINE X 2)
Culture: NO GROWTH
Culture: NO GROWTH
Special Requests: ADEQUATE

## 2023-10-25 DEATH — deceased
